# Patient Record
Sex: Female | Born: 1935 | Race: White | Hispanic: No | Marital: Married
Health system: Southern US, Community
[De-identification: ages and names within clinical notes are randomized; demographics above are authoritative.]

---

## 2003-10-17 ENCOUNTER — Other Ambulatory Visit: Payer: Self-pay

## 2004-08-29 ENCOUNTER — Ambulatory Visit: Payer: Self-pay | Admitting: Internal Medicine

## 2004-09-29 ENCOUNTER — Ambulatory Visit: Payer: Self-pay | Admitting: Internal Medicine

## 2004-10-29 ENCOUNTER — Ambulatory Visit: Payer: Self-pay | Admitting: Internal Medicine

## 2004-11-29 ENCOUNTER — Ambulatory Visit: Payer: Self-pay | Admitting: Internal Medicine

## 2004-12-23 ENCOUNTER — Emergency Department: Payer: Self-pay | Admitting: Emergency Medicine

## 2004-12-28 ENCOUNTER — Ambulatory Visit: Payer: Self-pay | Admitting: Internal Medicine

## 2004-12-30 ENCOUNTER — Ambulatory Visit: Payer: Self-pay | Admitting: Internal Medicine

## 2005-01-27 ENCOUNTER — Ambulatory Visit: Payer: Self-pay | Admitting: Internal Medicine

## 2005-02-27 ENCOUNTER — Ambulatory Visit: Payer: Self-pay | Admitting: Internal Medicine

## 2005-03-30 ENCOUNTER — Ambulatory Visit: Payer: Self-pay | Admitting: Gastroenterology

## 2005-04-22 ENCOUNTER — Ambulatory Visit: Payer: Self-pay | Admitting: Internal Medicine

## 2005-04-29 ENCOUNTER — Ambulatory Visit: Payer: Self-pay | Admitting: Internal Medicine

## 2005-05-12 ENCOUNTER — Ambulatory Visit: Payer: Self-pay | Admitting: Internal Medicine

## 2005-06-17 ENCOUNTER — Ambulatory Visit: Payer: Self-pay | Admitting: Internal Medicine

## 2005-06-29 ENCOUNTER — Ambulatory Visit: Payer: Self-pay | Admitting: Internal Medicine

## 2005-07-07 ENCOUNTER — Ambulatory Visit: Payer: Self-pay | Admitting: Unknown Physician Specialty

## 2005-08-16 ENCOUNTER — Ambulatory Visit: Payer: Self-pay | Admitting: Internal Medicine

## 2005-08-29 ENCOUNTER — Ambulatory Visit: Payer: Self-pay | Admitting: Internal Medicine

## 2005-10-15 ENCOUNTER — Ambulatory Visit: Payer: Self-pay | Admitting: Internal Medicine

## 2005-10-29 ENCOUNTER — Ambulatory Visit: Payer: Self-pay | Admitting: Internal Medicine

## 2005-12-14 ENCOUNTER — Ambulatory Visit: Payer: Self-pay | Admitting: Internal Medicine

## 2005-12-30 ENCOUNTER — Ambulatory Visit: Payer: Self-pay | Admitting: Internal Medicine

## 2006-02-11 ENCOUNTER — Ambulatory Visit: Payer: Self-pay | Admitting: Internal Medicine

## 2006-02-27 ENCOUNTER — Ambulatory Visit: Payer: Self-pay | Admitting: Internal Medicine

## 2006-04-13 ENCOUNTER — Ambulatory Visit: Payer: Self-pay | Admitting: Internal Medicine

## 2006-04-29 ENCOUNTER — Ambulatory Visit: Payer: Self-pay | Admitting: Internal Medicine

## 2006-06-08 ENCOUNTER — Ambulatory Visit: Payer: Self-pay | Admitting: Internal Medicine

## 2006-06-14 ENCOUNTER — Ambulatory Visit: Payer: Self-pay | Admitting: Internal Medicine

## 2006-06-29 ENCOUNTER — Ambulatory Visit: Payer: Self-pay | Admitting: Internal Medicine

## 2006-09-12 ENCOUNTER — Ambulatory Visit: Payer: Self-pay | Admitting: Internal Medicine

## 2006-09-29 ENCOUNTER — Ambulatory Visit: Payer: Self-pay | Admitting: Internal Medicine

## 2006-12-09 ENCOUNTER — Ambulatory Visit: Payer: Self-pay | Admitting: Internal Medicine

## 2006-12-30 ENCOUNTER — Ambulatory Visit: Payer: Self-pay | Admitting: Internal Medicine

## 2007-02-09 ENCOUNTER — Inpatient Hospital Stay: Payer: Self-pay | Admitting: Internal Medicine

## 2007-02-09 ENCOUNTER — Other Ambulatory Visit: Payer: Self-pay

## 2007-03-10 ENCOUNTER — Ambulatory Visit: Payer: Self-pay | Admitting: Internal Medicine

## 2007-03-30 ENCOUNTER — Ambulatory Visit: Payer: Self-pay | Admitting: Internal Medicine

## 2007-04-04 ENCOUNTER — Ambulatory Visit: Payer: Self-pay | Admitting: Gastroenterology

## 2007-05-23 ENCOUNTER — Ambulatory Visit: Payer: Self-pay | Admitting: Physician Assistant

## 2007-05-30 ENCOUNTER — Ambulatory Visit: Payer: Self-pay | Admitting: Internal Medicine

## 2007-06-09 ENCOUNTER — Ambulatory Visit: Payer: Self-pay | Admitting: Internal Medicine

## 2007-06-30 ENCOUNTER — Ambulatory Visit: Payer: Self-pay | Admitting: Internal Medicine

## 2007-07-19 ENCOUNTER — Ambulatory Visit: Payer: Self-pay | Admitting: Internal Medicine

## 2007-07-31 ENCOUNTER — Ambulatory Visit: Payer: Self-pay | Admitting: Internal Medicine

## 2007-09-08 ENCOUNTER — Ambulatory Visit: Payer: Self-pay | Admitting: Internal Medicine

## 2007-09-30 ENCOUNTER — Ambulatory Visit: Payer: Self-pay | Admitting: Internal Medicine

## 2007-12-08 ENCOUNTER — Ambulatory Visit: Payer: Self-pay | Admitting: Internal Medicine

## 2007-12-31 ENCOUNTER — Ambulatory Visit: Payer: Self-pay | Admitting: Internal Medicine

## 2008-02-28 ENCOUNTER — Ambulatory Visit: Payer: Self-pay | Admitting: Internal Medicine

## 2008-03-29 ENCOUNTER — Ambulatory Visit: Payer: Self-pay | Admitting: Internal Medicine

## 2008-05-29 ENCOUNTER — Ambulatory Visit: Payer: Self-pay | Admitting: Internal Medicine

## 2008-06-29 ENCOUNTER — Ambulatory Visit: Payer: Self-pay | Admitting: Internal Medicine

## 2008-07-30 ENCOUNTER — Ambulatory Visit: Payer: Self-pay | Admitting: Internal Medicine

## 2008-09-06 ENCOUNTER — Ambulatory Visit: Payer: Self-pay | Admitting: Internal Medicine

## 2008-09-13 ENCOUNTER — Emergency Department: Payer: Self-pay | Admitting: Emergency Medicine

## 2008-09-29 ENCOUNTER — Ambulatory Visit: Payer: Self-pay | Admitting: Internal Medicine

## 2008-11-29 ENCOUNTER — Ambulatory Visit: Payer: Self-pay | Admitting: Internal Medicine

## 2008-12-09 ENCOUNTER — Ambulatory Visit: Payer: Self-pay | Admitting: Internal Medicine

## 2008-12-30 ENCOUNTER — Ambulatory Visit: Payer: Self-pay | Admitting: Internal Medicine

## 2009-03-11 ENCOUNTER — Ambulatory Visit: Payer: Self-pay | Admitting: Internal Medicine

## 2009-03-29 ENCOUNTER — Ambulatory Visit: Payer: Self-pay | Admitting: Internal Medicine

## 2009-05-29 ENCOUNTER — Ambulatory Visit: Payer: Self-pay | Admitting: Internal Medicine

## 2009-06-10 ENCOUNTER — Ambulatory Visit: Payer: Self-pay | Admitting: Internal Medicine

## 2009-06-29 ENCOUNTER — Ambulatory Visit: Payer: Self-pay | Admitting: Internal Medicine

## 2009-07-30 ENCOUNTER — Ambulatory Visit: Payer: Self-pay | Admitting: Internal Medicine

## 2009-10-07 ENCOUNTER — Ambulatory Visit: Payer: Self-pay | Admitting: Internal Medicine

## 2009-10-29 ENCOUNTER — Ambulatory Visit: Payer: Self-pay | Admitting: Internal Medicine

## 2010-03-25 ENCOUNTER — Ambulatory Visit: Payer: Self-pay | Admitting: Internal Medicine

## 2010-03-29 ENCOUNTER — Ambulatory Visit: Payer: Self-pay | Admitting: Internal Medicine

## 2010-07-14 ENCOUNTER — Ambulatory Visit: Payer: Self-pay | Admitting: Internal Medicine

## 2010-07-30 ENCOUNTER — Ambulatory Visit: Payer: Self-pay | Admitting: Internal Medicine

## 2010-08-29 ENCOUNTER — Ambulatory Visit: Payer: Self-pay | Admitting: Internal Medicine

## 2010-10-15 ENCOUNTER — Ambulatory Visit: Payer: Self-pay | Admitting: Internal Medicine

## 2010-10-29 ENCOUNTER — Ambulatory Visit: Payer: Self-pay | Admitting: Internal Medicine

## 2011-01-15 ENCOUNTER — Ambulatory Visit: Payer: Self-pay | Admitting: Internal Medicine

## 2011-01-28 ENCOUNTER — Ambulatory Visit: Payer: Self-pay | Admitting: Internal Medicine

## 2011-04-15 ENCOUNTER — Ambulatory Visit: Payer: Self-pay | Admitting: Internal Medicine

## 2011-04-30 ENCOUNTER — Ambulatory Visit: Payer: Self-pay | Admitting: Internal Medicine

## 2011-05-26 ENCOUNTER — Ambulatory Visit: Payer: Self-pay | Admitting: Orthopedic Surgery

## 2011-06-23 ENCOUNTER — Emergency Department: Payer: Self-pay | Admitting: Emergency Medicine

## 2011-07-16 ENCOUNTER — Ambulatory Visit: Payer: Self-pay | Admitting: Internal Medicine

## 2011-07-31 ENCOUNTER — Ambulatory Visit: Payer: Self-pay | Admitting: Internal Medicine

## 2011-08-30 ENCOUNTER — Ambulatory Visit: Payer: Self-pay | Admitting: Internal Medicine

## 2011-09-30 ENCOUNTER — Ambulatory Visit: Payer: Self-pay | Admitting: Internal Medicine

## 2011-10-30 ENCOUNTER — Ambulatory Visit: Payer: Self-pay | Admitting: Internal Medicine

## 2011-12-08 ENCOUNTER — Ambulatory Visit: Payer: Self-pay | Admitting: Internal Medicine

## 2011-12-08 LAB — CBC CANCER CENTER
Basophil #: 0.1 x10 3/mm (ref 0.0–0.1)
Eosinophil #: 0.8 x10 3/mm — ABNORMAL HIGH (ref 0.0–0.7)
HCT: 33.3 % — ABNORMAL LOW (ref 35.0–47.0)
Lymphocyte %: 28 %
MCH: 37.9 pg — ABNORMAL HIGH (ref 26.0–34.0)
MCHC: 33.8 g/dL (ref 32.0–36.0)
Monocyte #: 0.2 x10 3/mm (ref 0.0–0.7)
Neutrophil #: 3.1 x10 3/mm (ref 1.4–6.5)
RDW: 17.8 % — ABNORMAL HIGH (ref 11.5–14.5)
WBC: 5.8 x10 3/mm (ref 3.6–11.0)

## 2011-12-31 ENCOUNTER — Ambulatory Visit: Payer: Self-pay | Admitting: Internal Medicine

## 2012-03-08 ENCOUNTER — Ambulatory Visit: Payer: Self-pay | Admitting: Internal Medicine

## 2012-03-08 LAB — CBC CANCER CENTER
Basophil %: 1 %
Eosinophil #: 0.9 x10 3/mm — ABNORMAL HIGH (ref 0.0–0.7)
Eosinophil %: 13.4 %
HCT: 34.4 % — ABNORMAL LOW (ref 35.0–47.0)
Lymphocyte #: 2.2 x10 3/mm (ref 1.0–3.6)
Lymphocyte %: 33.1 %
MCHC: 33.5 g/dL (ref 32.0–36.0)
MCV: 114 fL — ABNORMAL HIGH (ref 80–100)
Neutrophil #: 3 x10 3/mm (ref 1.4–6.5)
RBC: 3.02 10*6/uL — ABNORMAL LOW (ref 3.80–5.20)
WBC: 6.5 x10 3/mm (ref 3.6–11.0)

## 2012-03-29 ENCOUNTER — Ambulatory Visit: Payer: Self-pay | Admitting: Internal Medicine

## 2012-06-07 ENCOUNTER — Ambulatory Visit: Payer: Self-pay | Admitting: Internal Medicine

## 2012-06-07 LAB — CBC CANCER CENTER
Basophil #: 0.1 x10 3/mm (ref 0.0–0.1)
Basophil %: 1.2 %
Eosinophil %: 19.6 %
HCT: 34.1 % — ABNORMAL LOW (ref 35.0–47.0)
HGB: 11.1 g/dL — ABNORMAL LOW (ref 12.0–16.0)
MCH: 37.2 pg — ABNORMAL HIGH (ref 26.0–34.0)
MCV: 114 fL — ABNORMAL HIGH (ref 80–100)
Monocyte %: 3.7 %
Platelet: 268 x10 3/mm (ref 150–440)
RBC: 2.99 10*6/uL — ABNORMAL LOW (ref 3.80–5.20)
RDW: 18 % — ABNORMAL HIGH (ref 11.5–14.5)
WBC: 5.5 x10 3/mm (ref 3.6–11.0)

## 2012-06-29 ENCOUNTER — Ambulatory Visit: Payer: Self-pay | Admitting: Internal Medicine

## 2012-09-13 ENCOUNTER — Ambulatory Visit: Payer: Self-pay | Admitting: Internal Medicine

## 2012-09-13 LAB — CBC CANCER CENTER
Basophil #: 0.1 x10 3/mm (ref 0.0–0.1)
Basophil %: 2.3 %
Eosinophil #: 0.8 x10 3/mm — ABNORMAL HIGH (ref 0.0–0.7)
Eosinophil %: 13.9 %
HGB: 11.2 g/dL — ABNORMAL LOW (ref 12.0–16.0)
Lymphocyte %: 25 %
MCH: 37.1 pg — ABNORMAL HIGH (ref 26.0–34.0)
MCHC: 32.7 g/dL (ref 32.0–36.0)
Monocyte %: 3.8 %
Neutrophil %: 55 %
Platelet: 325 x10 3/mm (ref 150–440)
RDW: 19.3 % — ABNORMAL HIGH (ref 11.5–14.5)
WBC: 5.5 x10 3/mm (ref 3.6–11.0)

## 2012-09-29 ENCOUNTER — Ambulatory Visit: Payer: Self-pay | Admitting: Internal Medicine

## 2012-12-14 ENCOUNTER — Ambulatory Visit: Payer: Self-pay | Admitting: Internal Medicine

## 2012-12-15 LAB — CBC CANCER CENTER
Basophil %: 3.1 %
Eosinophil #: 0.1 x10 3/mm (ref 0.0–0.7)
HCT: 35.1 % (ref 35.0–47.0)
HGB: 11.9 g/dL — ABNORMAL LOW (ref 12.0–16.0)
Lymphocyte #: 1.7 x10 3/mm (ref 1.0–3.6)
MCHC: 34 g/dL (ref 32.0–36.0)
Monocyte #: 0.1 x10 3/mm — ABNORMAL LOW (ref 0.2–0.9)
Monocyte %: 2 %
Neutrophil #: 2.5 x10 3/mm (ref 1.4–6.5)
Platelet: 373 x10 3/mm (ref 150–440)
RBC: 3.12 10*6/uL — ABNORMAL LOW (ref 3.80–5.20)
RDW: 17.7 % — ABNORMAL HIGH (ref 11.5–14.5)

## 2012-12-23 ENCOUNTER — Inpatient Hospital Stay: Payer: Self-pay | Admitting: Orthopedic Surgery

## 2012-12-23 ENCOUNTER — Ambulatory Visit: Payer: Self-pay | Admitting: Orthopedic Surgery

## 2012-12-23 LAB — COMPREHENSIVE METABOLIC PANEL
Albumin: 3.6 g/dL (ref 3.4–5.0)
Alkaline Phosphatase: 76 U/L (ref 50–136)
BUN: 16 mg/dL (ref 7–18)
Bilirubin,Total: 0.4 mg/dL (ref 0.2–1.0)
Calcium, Total: 8.4 mg/dL — ABNORMAL LOW (ref 8.5–10.1)
Chloride: 104 mmol/L (ref 98–107)
Co2: 29 mmol/L (ref 21–32)
EGFR (Non-African Amer.): 60 — ABNORMAL LOW
Potassium: 3.9 mmol/L (ref 3.5–5.1)
SGOT(AST): 16 U/L (ref 15–37)
Sodium: 140 mmol/L (ref 136–145)
Total Protein: 6.5 g/dL (ref 6.4–8.2)

## 2012-12-23 LAB — PROTIME-INR
INR: 1
Prothrombin Time: 13.4 secs (ref 11.5–14.7)

## 2012-12-23 LAB — CBC WITH DIFFERENTIAL/PLATELET
Basophil #: 0.1 10*3/uL (ref 0.0–0.1)
Basophil %: 1 %
Eosinophil #: 1.5 10*3/uL — ABNORMAL HIGH (ref 0.0–0.7)
HGB: 10.7 g/dL — ABNORMAL LOW (ref 12.0–16.0)
Monocyte %: 3.7 %
Neutrophil %: 69.8 %
Platelet: 376 10*3/uL (ref 150–440)
WBC: 13 10*3/uL — ABNORMAL HIGH (ref 3.6–11.0)

## 2012-12-26 LAB — CBC WITH DIFFERENTIAL/PLATELET
Basophil #: 0 10*3/uL (ref 0.0–0.1)
Basophil %: 0.7 %
Eosinophil #: 0.2 10*3/uL (ref 0.0–0.7)
HCT: 17 % — ABNORMAL LOW (ref 35.0–47.0)
HGB: 5.7 g/dL — ABNORMAL LOW (ref 12.0–16.0)
Lymphocyte #: 1.5 10*3/uL (ref 1.0–3.6)
Lymphocyte %: 22.7 %
MCH: 38.7 pg — ABNORMAL HIGH (ref 26.0–34.0)
Monocyte %: 11.5 %
Neutrophil #: 4.2 10*3/uL (ref 1.4–6.5)
Platelet: 241 10*3/uL (ref 150–440)
WBC: 6.6 10*3/uL (ref 3.6–11.0)

## 2012-12-26 LAB — URINALYSIS, COMPLETE
Glucose,UR: NEGATIVE mg/dL (ref 0–75)
Ketone: NEGATIVE
Nitrite: NEGATIVE
Specific Gravity: 1.015 (ref 1.003–1.030)
WBC UR: 33 /HPF (ref 0–5)

## 2012-12-26 LAB — HEMOGLOBIN: HGB: 7.6 g/dL — ABNORMAL LOW (ref 12.0–16.0)

## 2012-12-26 LAB — BASIC METABOLIC PANEL
BUN: 13 mg/dL (ref 7–18)
Co2: 27 mmol/L (ref 21–32)
Creatinine: 0.63 mg/dL (ref 0.60–1.30)
EGFR (African American): >60
Glucose: 98 mg/dL (ref 65–99)
Osmolality: 281 (ref 275–301)
Potassium: 4 mmol/L (ref 3.5–5.1)

## 2012-12-27 LAB — HEMOGLOBIN: HGB: 6.7 g/dL — ABNORMAL LOW (ref 12.0–16.0)

## 2012-12-28 ENCOUNTER — Encounter: Payer: Self-pay | Admitting: Internal Medicine

## 2012-12-28 LAB — BASIC METABOLIC PANEL
BUN: 9 mg/dL (ref 7–18)
Calcium, Total: 8.1 mg/dL — ABNORMAL LOW (ref 8.5–10.1)
Chloride: 109 mmol/L — ABNORMAL HIGH (ref 98–107)
Co2: 28 mmol/L (ref 21–32)
Creatinine: 0.63 mg/dL (ref 0.60–1.30)
EGFR (African American): >60

## 2012-12-28 LAB — URINE CULTURE

## 2012-12-30 ENCOUNTER — Ambulatory Visit: Payer: Self-pay | Admitting: Internal Medicine

## 2012-12-30 ENCOUNTER — Encounter: Payer: Self-pay | Admitting: Internal Medicine

## 2013-02-27 ENCOUNTER — Ambulatory Visit: Payer: Self-pay | Admitting: Internal Medicine

## 2013-03-15 LAB — CBC CANCER CENTER
Eosinophil #: 1.3 x10 3/mm — ABNORMAL HIGH (ref 0.0–0.7)
HCT: 30.8 % — ABNORMAL LOW (ref 35.0–47.0)
Lymphocyte #: 2.4 x10 3/mm (ref 1.0–3.6)
Lymphocyte %: 47.8 %
MCH: 37.5 pg — ABNORMAL HIGH (ref 26.0–34.0)
MCHC: 33.7 g/dL (ref 32.0–36.0)
MCV: 111 fL — ABNORMAL HIGH (ref 80–100)
Monocyte #: 0.2 x10 3/mm (ref 0.2–0.9)
Neutrophil %: 21.6 %
Platelet: 235 x10 3/mm (ref 150–440)
WBC: 5 x10 3/mm (ref 3.6–11.0)

## 2013-03-29 ENCOUNTER — Ambulatory Visit: Payer: Self-pay | Admitting: Internal Medicine

## 2013-06-14 ENCOUNTER — Ambulatory Visit: Payer: Self-pay | Admitting: Internal Medicine

## 2013-06-25 LAB — CBC CANCER CENTER
Basophil #: 0 x10 3/mm (ref 0.0–0.1)
Basophil %: 1.3 %
Eosinophil #: 0 x10 3/mm (ref 0.0–0.7)
Eosinophil %: 0.9 %
HCT: 33.5 % — ABNORMAL LOW (ref 35.0–47.0)
HGB: 11.7 g/dL — ABNORMAL LOW (ref 12.0–16.0)
Lymphocyte #: 1.3 x10 3/mm (ref 1.0–3.6)
MCHC: 35 g/dL (ref 32.0–36.0)
Monocyte %: 2.4 %
Neutrophil #: 2.5 x10 3/mm (ref 1.4–6.5)
Neutrophil %: 62.2 %

## 2013-06-29 ENCOUNTER — Ambulatory Visit: Payer: Self-pay | Admitting: Internal Medicine

## 2013-09-24 ENCOUNTER — Ambulatory Visit: Payer: Self-pay | Admitting: Internal Medicine

## 2013-09-24 LAB — CBC CANCER CENTER
Basophil %: 2.6 %
Eosinophil #: 2.4 x10 3/mm — ABNORMAL HIGH (ref 0.0–0.7)
Eosinophil %: 27.4 %
HGB: 12 g/dL (ref 12.0–16.0)
Lymphocyte #: 2.9 x10 3/mm (ref 1.0–3.6)
Lymphocyte %: 32.1 %
MCHC: 33.9 g/dL (ref 32.0–36.0)
MCV: 115 fL — ABNORMAL HIGH (ref 80–100)
Monocyte #: 0.6 x10 3/mm (ref 0.2–0.9)
Neutrophil #: 2.8 x10 3/mm (ref 1.4–6.5)
Neutrophil %: 31.5 %
Platelet: 372 x10 3/mm (ref 150–440)
RBC: 3.08 10*6/uL — ABNORMAL LOW (ref 3.80–5.20)
RDW: 18.4 % — ABNORMAL HIGH (ref 11.5–14.5)

## 2013-09-29 ENCOUNTER — Ambulatory Visit: Payer: Self-pay | Admitting: Internal Medicine

## 2013-12-14 ENCOUNTER — Ambulatory Visit: Payer: Self-pay | Admitting: Internal Medicine

## 2013-12-14 LAB — CBC CANCER CENTER
BASOS PCT: 2.5 %
Basophil #: 0.1 x10 3/mm (ref 0.0–0.1)
EOS ABS: 0.5 x10 3/mm (ref 0.0–0.7)
EOS PCT: 8.8 %
HCT: 33 % — AB (ref 35.0–47.0)
HGB: 10.8 g/dL — ABNORMAL LOW (ref 12.0–16.0)
LYMPHS ABS: 2.1 x10 3/mm (ref 1.0–3.6)
Lymphocyte %: 38 %
MCH: 37.8 pg — AB (ref 26.0–34.0)
MCHC: 32.8 g/dL (ref 32.0–36.0)
MCV: 115 fL — ABNORMAL HIGH (ref 80–100)
MONO ABS: 0.4 x10 3/mm (ref 0.2–0.9)
MONOS PCT: 7.4 %
NEUTROS ABS: 2.4 x10 3/mm (ref 1.4–6.5)
Neutrophil %: 43.3 %
PLATELETS: 320 x10 3/mm (ref 150–440)
RBC: 2.86 10*6/uL — AB (ref 3.80–5.20)
RDW: 19.3 % — AB (ref 11.5–14.5)
WBC: 5.5 x10 3/mm (ref 3.6–11.0)

## 2013-12-30 ENCOUNTER — Ambulatory Visit: Payer: Self-pay | Admitting: Internal Medicine

## 2014-01-02 LAB — CBC CANCER CENTER
BASOS PCT: 1.4 %
Basophil #: 0.1 x10 3/mm (ref 0.0–0.1)
EOS PCT: 12.9 %
Eosinophil #: 0.9 x10 3/mm — ABNORMAL HIGH (ref 0.0–0.7)
HCT: 35.5 % (ref 35.0–47.0)
HGB: 11.7 g/dL — AB (ref 12.0–16.0)
Lymphocyte #: 2.8 x10 3/mm (ref 1.0–3.6)
Lymphocyte %: 38.6 %
MCH: 38.4 pg — ABNORMAL HIGH (ref 26.0–34.0)
MCHC: 32.9 g/dL (ref 32.0–36.0)
MCV: 117 fL — AB (ref 80–100)
Monocyte #: 0.4 x10 3/mm (ref 0.2–0.9)
Monocyte %: 5.9 %
NEUTROS PCT: 41.2 %
Neutrophil #: 3 x10 3/mm (ref 1.4–6.5)
PLATELETS: 340 x10 3/mm (ref 150–440)
RBC: 3.04 10*6/uL — AB (ref 3.80–5.20)
RDW: 19.6 % — AB (ref 11.5–14.5)
WBC: 7.2 x10 3/mm (ref 3.6–11.0)

## 2014-01-02 LAB — FERRITIN: Ferritin (ARMC): 217 ng/mL (ref 8–388)

## 2014-01-02 LAB — IRON AND TIBC
IRON: 113 ug/dL (ref 50–170)
Iron Bind.Cap.(Total): 265 ug/dL (ref 250–450)
Iron Saturation: 43 %
Unbound Iron-Bind.Cap.: 152 ug/dL

## 2014-01-27 ENCOUNTER — Ambulatory Visit: Payer: Self-pay | Admitting: Internal Medicine

## 2014-01-30 LAB — CBC CANCER CENTER
Basophil #: 0.2 x10 3/mm — ABNORMAL HIGH (ref 0.0–0.1)
Basophil %: 1.8 %
Eosinophil #: 1.6 x10 3/mm — ABNORMAL HIGH (ref 0.0–0.7)
Eosinophil %: 13.9 %
HCT: 36.8 % (ref 35.0–47.0)
HGB: 11.8 g/dL — ABNORMAL LOW (ref 12.0–16.0)
LYMPHS ABS: 3.5 x10 3/mm (ref 1.0–3.6)
Lymphocyte %: 30.9 %
MCH: 37.3 pg — ABNORMAL HIGH (ref 26.0–34.0)
MCHC: 32 g/dL (ref 32.0–36.0)
MCV: 117 fL — ABNORMAL HIGH (ref 80–100)
Monocyte #: 0.6 x10 3/mm (ref 0.2–0.9)
Monocyte %: 5.2 %
NEUTROS ABS: 5.5 x10 3/mm (ref 1.4–6.5)
Neutrophil %: 48.2 %
Platelet: 440 x10 3/mm (ref 150–440)
RBC: 3.15 10*6/uL — ABNORMAL LOW (ref 3.80–5.20)
RDW: 19.5 % — AB (ref 11.5–14.5)
WBC: 11.4 x10 3/mm — ABNORMAL HIGH (ref 3.6–11.0)

## 2014-02-20 LAB — CBC CANCER CENTER
Basophil #: 0.1 x10 3/mm (ref 0.0–0.1)
Basophil %: 2.3 %
Eosinophil #: 0.3 x10 3/mm (ref 0.0–0.7)
Eosinophil %: 4.7 %
HCT: 31.1 % — AB (ref 35.0–47.0)
HGB: 10.2 g/dL — AB (ref 12.0–16.0)
LYMPHS ABS: 1.4 x10 3/mm (ref 1.0–3.6)
Lymphocyte %: 23.2 %
MCH: 37.4 pg — ABNORMAL HIGH (ref 26.0–34.0)
MCHC: 32.7 g/dL (ref 32.0–36.0)
MCV: 114 fL — AB (ref 80–100)
MONO ABS: 0.3 x10 3/mm (ref 0.2–0.9)
MONOS PCT: 4.5 %
NEUTROS PCT: 65.3 %
Neutrophil #: 3.9 x10 3/mm (ref 1.4–6.5)
Platelet: 395 x10 3/mm (ref 150–440)
RBC: 2.72 10*6/uL — ABNORMAL LOW (ref 3.80–5.20)
RDW: 19.7 % — AB (ref 11.5–14.5)
WBC: 6 x10 3/mm (ref 3.6–11.0)

## 2014-02-27 ENCOUNTER — Ambulatory Visit: Payer: Self-pay | Admitting: Internal Medicine

## 2014-03-07 LAB — OCCULT BLOOD X 1 CARD TO LAB, STOOL
OCCULT BLOOD, FECES: NEGATIVE
OCCULT BLOOD, FECES: NEGATIVE
Occult Blood, Feces: NEGATIVE

## 2014-03-11 LAB — CBC CANCER CENTER
Basophil #: 0.1 x10 3/mm (ref 0.0–0.1)
Basophil %: 1.4 %
Eosinophil #: 0.3 x10 3/mm (ref 0.0–0.7)
Eosinophil %: 4.1 %
HCT: 33.4 % — ABNORMAL LOW (ref 35.0–47.0)
HGB: 10.8 g/dL — ABNORMAL LOW (ref 12.0–16.0)
Lymphocyte #: 0.9 x10 3/mm — ABNORMAL LOW (ref 1.0–3.6)
Lymphocyte %: 12 %
MCH: 37.9 pg — ABNORMAL HIGH (ref 26.0–34.0)
MCHC: 32.5 g/dL (ref 32.0–36.0)
MCV: 117 fL — ABNORMAL HIGH (ref 80–100)
MONOS PCT: 4.1 %
Monocyte #: 0.3 x10 3/mm (ref 0.2–0.9)
NEUTROS PCT: 78.4 %
Neutrophil #: 6.1 x10 3/mm (ref 1.4–6.5)
Platelet: 333 x10 3/mm (ref 150–440)
RBC: 2.86 10*6/uL — AB (ref 3.80–5.20)
RDW: 20.4 % — ABNORMAL HIGH (ref 11.5–14.5)
WBC: 7.8 x10 3/mm (ref 3.6–11.0)

## 2014-03-11 LAB — RETICULOCYTES
ABSOLUTE RETIC COUNT: 0.0509 10*6/uL (ref 0.019–0.186)
RETICULOCYTE: 1.78 % (ref 0.4–3.1)

## 2014-03-11 LAB — CREATININE, SERUM
Creatinine: 0.78 mg/dL (ref 0.60–1.30)
EGFR (African American): >60

## 2014-03-12 LAB — KAPPA/LAMBDA FREE LIGHT CHAINS (ARMC)

## 2014-03-12 LAB — PROT IMMUNOELECTROPHORES(ARMC)

## 2014-03-29 ENCOUNTER — Ambulatory Visit: Payer: Self-pay | Admitting: Internal Medicine

## 2014-04-08 LAB — CBC CANCER CENTER
BASOS ABS: 0.2 x10 3/mm — AB (ref 0.0–0.1)
Basophil %: 2.7 %
EOS PCT: 2.8 %
Eosinophil #: 0.2 x10 3/mm (ref 0.0–0.7)
HCT: 34.3 % — ABNORMAL LOW (ref 35.0–47.0)
HGB: 11.5 g/dL — ABNORMAL LOW (ref 12.0–16.0)
Lymphocyte #: 1.2 x10 3/mm (ref 1.0–3.6)
Lymphocyte %: 21.1 %
MCH: 38.5 pg — AB (ref 26.0–34.0)
MCHC: 33.6 g/dL (ref 32.0–36.0)
MCV: 115 fL — AB (ref 80–100)
Monocyte #: 0.1 x10 3/mm — ABNORMAL LOW (ref 0.2–0.9)
Monocyte %: 2.1 %
Neutrophil #: 4.2 x10 3/mm (ref 1.4–6.5)
Neutrophil %: 71.3 %
Platelet: 344 x10 3/mm (ref 150–440)
RBC: 3 10*6/uL — AB (ref 3.80–5.20)
RDW: 20.5 % — ABNORMAL HIGH (ref 11.5–14.5)
WBC: 5.8 x10 3/mm (ref 3.6–11.0)

## 2014-04-29 ENCOUNTER — Ambulatory Visit: Payer: Self-pay | Admitting: Internal Medicine

## 2014-06-10 ENCOUNTER — Ambulatory Visit: Payer: Self-pay | Admitting: Internal Medicine

## 2014-06-10 LAB — CBC CANCER CENTER
BASOS PCT: 1.8 %
Basophil #: 0.1 x10 3/mm (ref 0.0–0.1)
EOS PCT: 3.1 %
Eosinophil #: 0.2 x10 3/mm (ref 0.0–0.7)
HCT: 34.4 % — AB (ref 35.0–47.0)
HGB: 11.2 g/dL — ABNORMAL LOW (ref 12.0–16.0)
LYMPHS PCT: 17.3 %
Lymphocyte #: 1.2 x10 3/mm (ref 1.0–3.6)
MCH: 37.6 pg — AB (ref 26.0–34.0)
MCHC: 32.5 g/dL (ref 32.0–36.0)
MCV: 116 fL — AB (ref 80–100)
MONO ABS: 0.1 x10 3/mm — AB (ref 0.2–0.9)
Monocyte %: 1.4 %
NEUTROS ABS: 5.4 x10 3/mm (ref 1.4–6.5)
Neutrophil %: 76.4 %
PLATELETS: 353 x10 3/mm (ref 150–440)
RBC: 2.97 10*6/uL — ABNORMAL LOW (ref 3.80–5.20)
RDW: 20.8 % — ABNORMAL HIGH (ref 11.5–14.5)
WBC: 7.1 x10 3/mm (ref 3.6–11.0)

## 2014-06-29 ENCOUNTER — Ambulatory Visit: Payer: Self-pay | Admitting: Internal Medicine

## 2014-08-06 ENCOUNTER — Ambulatory Visit: Payer: Self-pay | Admitting: Internal Medicine

## 2014-08-06 LAB — CBC CANCER CENTER
Basophil #: 0.1 x10 3/mm (ref 0.0–0.1)
Basophil %: 1.4 %
Eosinophil #: 0.8 x10 3/mm — ABNORMAL HIGH (ref 0.0–0.7)
Eosinophil %: 11.2 %
HCT: 33.6 % — AB (ref 35.0–47.0)
HGB: 10.9 g/dL — ABNORMAL LOW (ref 12.0–16.0)
Lymphocyte #: 3 x10 3/mm (ref 1.0–3.6)
Lymphocyte %: 40.2 %
MCH: 37.9 pg — ABNORMAL HIGH (ref 26.0–34.0)
MCHC: 32.5 g/dL (ref 32.0–36.0)
MCV: 117 fL — AB (ref 80–100)
Monocyte #: 0.4 x10 3/mm (ref 0.2–0.9)
Monocyte %: 5.5 %
Neutrophil #: 3.1 x10 3/mm (ref 1.4–6.5)
Neutrophil %: 41.7 %
PLATELETS: 319 x10 3/mm (ref 150–440)
RBC: 2.88 10*6/uL — AB (ref 3.80–5.20)
RDW: 21.4 % — ABNORMAL HIGH (ref 11.5–14.5)
WBC: 7.3 x10 3/mm (ref 3.6–11.0)

## 2014-08-29 ENCOUNTER — Ambulatory Visit: Payer: Self-pay | Admitting: Internal Medicine

## 2014-09-30 ENCOUNTER — Ambulatory Visit: Payer: Self-pay | Admitting: Internal Medicine

## 2014-09-30 LAB — CBC CANCER CENTER
Basophil #: 0.1 x10 3/mm (ref 0.0–0.1)
Basophil %: 1.1 %
EOS ABS: 0.4 x10 3/mm (ref 0.0–0.7)
EOS PCT: 4.6 %
HCT: 38 % (ref 35.0–47.0)
HGB: 12.6 g/dL (ref 12.0–16.0)
Lymphocyte #: 1.7 x10 3/mm (ref 1.0–3.6)
Lymphocyte %: 17.7 %
MCH: 39.2 pg — ABNORMAL HIGH (ref 26.0–34.0)
MCHC: 33.1 g/dL (ref 32.0–36.0)
MCV: 118 fL — ABNORMAL HIGH (ref 80–100)
MONO ABS: 0.3 x10 3/mm (ref 0.2–0.9)
MONOS PCT: 2.7 %
Neutrophil #: 7.1 x10 3/mm — ABNORMAL HIGH (ref 1.4–6.5)
Neutrophil %: 73.9 %
Platelet: 324 x10 3/mm (ref 150–440)
RBC: 3.21 10*6/uL — AB (ref 3.80–5.20)
RDW: 19.6 % — ABNORMAL HIGH (ref 11.5–14.5)
WBC: 9.6 x10 3/mm (ref 3.6–11.0)

## 2014-10-03 ENCOUNTER — Inpatient Hospital Stay: Payer: Self-pay | Admitting: Internal Medicine

## 2014-10-03 LAB — CBC WITH DIFFERENTIAL/PLATELET
Bands: 9 %
EOS PCT: 4 %
HCT: 28 % — AB (ref 35.0–47.0)
HGB: 9 g/dL — AB (ref 12.0–16.0)
Lymphocytes: 26 %
MCH: 38.5 pg — ABNORMAL HIGH (ref 26.0–34.0)
MCHC: 32.1 g/dL (ref 32.0–36.0)
MCV: 120 fL — AB (ref 80–100)
Monocytes: 10 %
NRBC/100 WBC: 6 /
PLATELETS: 283 10*3/uL (ref 150–440)
RBC: 2.33 10*6/uL — ABNORMAL LOW (ref 3.80–5.20)
RDW: 19.6 % — ABNORMAL HIGH (ref 11.5–14.5)
Segmented Neutrophils: 51 %
WBC: 13.9 10*3/uL — ABNORMAL HIGH (ref 3.6–11.0)

## 2014-10-03 LAB — HEMOGLOBIN: HGB: 8.7 g/dL — ABNORMAL LOW (ref 12.0–16.0)

## 2014-10-03 LAB — BASIC METABOLIC PANEL
Anion Gap: 7 (ref 7–16)
BUN: 16 mg/dL (ref 7–18)
Calcium, Total: 8.2 mg/dL — ABNORMAL LOW (ref 8.5–10.1)
Chloride: 105 mmol/L (ref 98–107)
Co2: 29 mmol/L (ref 21–32)
Creatinine: 0.92 mg/dL (ref 0.60–1.30)
EGFR (African American): >60
Glucose: 134 mg/dL — ABNORMAL HIGH (ref 65–99)
Osmolality: 284 (ref 275–301)
Potassium: 4 mmol/L (ref 3.5–5.1)
SODIUM: 141 mmol/L (ref 136–145)

## 2014-10-03 LAB — APTT: Activated PTT: 25.4 secs (ref 23.6–35.9)

## 2014-10-03 LAB — PROTIME-INR
INR: 1.1
PROTHROMBIN TIME: 13.9 s (ref 11.5–14.7)

## 2014-10-05 LAB — CBC WITH DIFFERENTIAL/PLATELET
BASOS ABS: 0.1 10*3/uL (ref 0.0–0.1)
Basophil %: 0.6 %
Eosinophil #: 0.2 10*3/uL (ref 0.0–0.7)
Eosinophil %: 1 %
HCT: 21.6 % — ABNORMAL LOW (ref 35.0–47.0)
HGB: 7 g/dL — ABNORMAL LOW (ref 12.0–16.0)
LYMPHS ABS: 1.3 10*3/uL (ref 1.0–3.6)
Lymphocyte %: 7.1 %
MCH: 38.2 pg — AB (ref 26.0–34.0)
MCHC: 32.3 g/dL (ref 32.0–36.0)
MCV: 118 fL — AB (ref 80–100)
MONOS PCT: 4 %
Monocyte #: 0.7 x10 3/mm (ref 0.2–0.9)
NEUTROS ABS: 15.3 10*3/uL — AB (ref 1.4–6.5)
Neutrophil %: 87.3 %
Platelet: 197 10*3/uL (ref 150–440)
RBC: 1.82 10*6/uL — ABNORMAL LOW (ref 3.80–5.20)
RDW: 19.6 % — AB (ref 11.5–14.5)
WBC: 17.5 10*3/uL — ABNORMAL HIGH (ref 3.6–11.0)

## 2014-10-05 LAB — BASIC METABOLIC PANEL
Anion Gap: 5 — ABNORMAL LOW (ref 7–16)
BUN: 16 mg/dL (ref 7–18)
CALCIUM: 8.1 mg/dL — AB (ref 8.5–10.1)
CHLORIDE: 105 mmol/L (ref 98–107)
CREATININE: 0.74 mg/dL (ref 0.60–1.30)
Co2: 29 mmol/L (ref 21–32)
EGFR (African American): >60
EGFR (Non-African Amer.): >60
GLUCOSE: 87 mg/dL (ref 65–99)
OSMOLALITY: 278 (ref 275–301)
POTASSIUM: 3.8 mmol/L (ref 3.5–5.1)
Sodium: 139 mmol/L (ref 136–145)

## 2014-10-05 LAB — URINALYSIS, COMPLETE
BACTERIA: NONE SEEN
Bilirubin,UR: NEGATIVE
Glucose,UR: NEGATIVE mg/dL (ref 0–75)
Ketone: NEGATIVE
Nitrite: NEGATIVE
PH: 5 (ref 4.5–8.0)
RBC,UR: 43 /HPF (ref 0–5)
Specific Gravity: 1.023 (ref 1.003–1.030)
Squamous Epithelial: NONE SEEN
WBC UR: 37 /HPF (ref 0–5)

## 2014-10-06 LAB — CBC WITH DIFFERENTIAL/PLATELET
Basophil #: 0.1 10*3/uL (ref 0.0–0.1)
Basophil %: 1 %
Eosinophil #: 0.3 10*3/uL (ref 0.0–0.7)
Eosinophil %: 4.7 %
HCT: 20.7 % — AB (ref 35.0–47.0)
HGB: 6.8 g/dL — ABNORMAL LOW (ref 12.0–16.0)
Lymphocyte #: 0.9 10*3/uL — ABNORMAL LOW (ref 1.0–3.6)
Lymphocyte %: 12.5 %
MCH: 39.2 pg — ABNORMAL HIGH (ref 26.0–34.0)
MCHC: 32.9 g/dL (ref 32.0–36.0)
MCV: 119 fL — ABNORMAL HIGH (ref 80–100)
Monocyte #: 0.4 x10 3/mm (ref 0.2–0.9)
Monocyte %: 5.1 %
Neutrophil #: 5.8 10*3/uL (ref 1.4–6.5)
Neutrophil %: 76.7 %
Platelet: 206 10*3/uL (ref 150–440)
RBC: 1.73 10*6/uL — AB (ref 3.80–5.20)
RDW: 19.4 % — ABNORMAL HIGH (ref 11.5–14.5)
WBC: 7.5 10*3/uL (ref 3.6–11.0)

## 2014-10-06 LAB — BASIC METABOLIC PANEL
ANION GAP: 7 (ref 7–16)
ANION GAP: 5 — AB (ref 7–16)
BUN: 12 mg/dL (ref 7–18)
BUN: 12 mg/dL (ref 7–18)
CO2: 29 mmol/L (ref 21–32)
Calcium, Total: 7.7 mg/dL — ABNORMAL LOW (ref 8.5–10.1)
Calcium, Total: 8 mg/dL — ABNORMAL LOW (ref 8.5–10.1)
Chloride: 106 mmol/L (ref 98–107)
Chloride: 106 mmol/L (ref 98–107)
Co2: 29 mmol/L (ref 21–32)
Creatinine: 0.78 mg/dL (ref 0.60–1.30)
Creatinine: 0.74 mg/dL (ref 0.60–1.30)
EGFR (African American): >60
EGFR (Non-African Amer.): >60
Glucose: 82 mg/dL (ref 65–99)
Glucose: 103 mg/dL — ABNORMAL HIGH (ref 65–99)
OSMOLALITY: 279 (ref 275–301)
Osmolality: 282 (ref 275–301)
POTASSIUM: 3.5 mmol/L (ref 3.5–5.1)
Potassium: 3.9 mmol/L (ref 3.5–5.1)
Sodium: 142 mmol/L (ref 136–145)
Sodium: 140 mmol/L (ref 136–145)

## 2014-10-06 LAB — PLATELET COUNT: Platelet: 209 10*3/uL (ref 150–440)

## 2014-10-07 LAB — BASIC METABOLIC PANEL
Anion Gap: 6 — ABNORMAL LOW (ref 7–16)
BUN: 11 mg/dL (ref 7–18)
CO2: 29 mmol/L (ref 21–32)
Calcium, Total: 8 mg/dL — ABNORMAL LOW (ref 8.5–10.1)
Chloride: 105 mmol/L (ref 98–107)
Creatinine: 0.71 mg/dL (ref 0.60–1.30)
EGFR (African American): >60
EGFR (Non-African Amer.): >60
Glucose: 95 mg/dL (ref 65–99)
Osmolality: 279 (ref 275–301)
Potassium: 3.5 mmol/L (ref 3.5–5.1)
Sodium: 140 mmol/L (ref 136–145)

## 2014-10-07 LAB — CBC WITH DIFFERENTIAL/PLATELET
BASOS PCT: 1.3 %
Basophil #: 0.1 10*3/uL (ref 0.0–0.1)
Eosinophil #: 0.6 10*3/uL (ref 0.0–0.7)
Eosinophil %: 8 %
HCT: 28 % — ABNORMAL LOW (ref 35.0–47.0)
HGB: 9.5 g/dL — AB (ref 12.0–16.0)
LYMPHS ABS: 1 10*3/uL (ref 1.0–3.6)
LYMPHS PCT: 13.8 %
MCH: 36.8 pg — AB (ref 26.0–34.0)
MCHC: 34 g/dL (ref 32.0–36.0)
MCV: 108 fL — AB (ref 80–100)
MONO ABS: 0.5 x10 3/mm (ref 0.2–0.9)
MONOS PCT: 6.9 %
NEUTROS PCT: 70 %
Neutrophil #: 4.9 10*3/uL (ref 1.4–6.5)
Platelet: 301 10*3/uL (ref 150–440)
RBC: 2.58 10*6/uL — ABNORMAL LOW (ref 3.80–5.20)
RDW: 24.8 % — ABNORMAL HIGH (ref 11.5–14.5)
WBC: 7.1 10*3/uL (ref 3.6–11.0)

## 2014-10-08 LAB — CBC WITH DIFFERENTIAL/PLATELET
BASOS PCT: 1.3 %
Basophil #: 0.1 10*3/uL (ref 0.0–0.1)
Eosinophil #: 0.7 10*3/uL (ref 0.0–0.7)
Eosinophil %: 12.3 %
HCT: 26.5 % — ABNORMAL LOW (ref 35.0–47.0)
HGB: 8.9 g/dL — ABNORMAL LOW (ref 12.0–16.0)
LYMPHS ABS: 1.4 10*3/uL (ref 1.0–3.6)
LYMPHS PCT: 23.5 %
MCH: 37.2 pg — AB (ref 26.0–34.0)
MCHC: 33.4 g/dL (ref 32.0–36.0)
MCV: 111 fL — ABNORMAL HIGH (ref 80–100)
Monocyte #: 0.5 x10 3/mm (ref 0.2–0.9)
Monocyte %: 7.4 %
Neutrophil #: 3.4 10*3/uL (ref 1.4–6.5)
Neutrophil %: 55.5 %
Platelet: 299 10*3/uL (ref 150–440)
RBC: 2.38 10*6/uL — ABNORMAL LOW (ref 3.80–5.20)
RDW: 24.7 % — ABNORMAL HIGH (ref 11.5–14.5)
WBC: 6.1 10*3/uL (ref 3.6–11.0)

## 2014-10-08 LAB — BASIC METABOLIC PANEL
ANION GAP: 6 — AB (ref 7–16)
BUN: 12 mg/dL (ref 7–18)
CALCIUM: 7.9 mg/dL — AB (ref 8.5–10.1)
CHLORIDE: 107 mmol/L (ref 98–107)
CO2: 30 mmol/L (ref 21–32)
CREATININE: 0.78 mg/dL (ref 0.60–1.30)
EGFR (African American): >60
Glucose: 88 mg/dL (ref 65–99)
OSMOLALITY: 284 (ref 275–301)
Potassium: 3.6 mmol/L (ref 3.5–5.1)
SODIUM: 143 mmol/L (ref 136–145)

## 2014-10-10 LAB — URINE CULTURE

## 2014-10-29 ENCOUNTER — Ambulatory Visit: Payer: Self-pay | Admitting: Internal Medicine

## 2014-11-05 ENCOUNTER — Observation Stay: Payer: Self-pay | Admitting: Internal Medicine

## 2014-11-05 LAB — TROPONIN I
Troponin-I: 0.02 ng/mL
Troponin-I: 0.02 ng/mL

## 2014-11-05 LAB — COMPREHENSIVE METABOLIC PANEL
AST: 20 U/L (ref 15–37)
Albumin: 2.9 g/dL — ABNORMAL LOW (ref 3.4–5.0)
Alkaline Phosphatase: 146 U/L — ABNORMAL HIGH
Anion Gap: 5 — ABNORMAL LOW (ref 7–16)
BILIRUBIN TOTAL: 0.6 mg/dL (ref 0.2–1.0)
BUN: 12 mg/dL (ref 7–18)
CO2: 31 mmol/L (ref 21–32)
CREATININE: 0.75 mg/dL (ref 0.60–1.30)
Calcium, Total: 8.5 mg/dL (ref 8.5–10.1)
Chloride: 102 mmol/L (ref 98–107)
EGFR (African American): >60
EGFR (Non-African Amer.): >60
Glucose: 90 mg/dL (ref 65–99)
Osmolality: 275 (ref 275–301)
Potassium: 4 mmol/L (ref 3.5–5.1)
SGPT (ALT): 12 U/L — ABNORMAL LOW
Sodium: 138 mmol/L (ref 136–145)
TOTAL PROTEIN: 6 g/dL — AB (ref 6.4–8.2)

## 2014-11-05 LAB — CBC WITH DIFFERENTIAL/PLATELET
BASOS PCT: 1 %
Basophil #: 0.1 10*3/uL (ref 0.0–0.1)
Eosinophil #: 1.6 10*3/uL — ABNORMAL HIGH (ref 0.0–0.7)
Eosinophil %: 17.5 %
HCT: 38 % (ref 35.0–47.0)
HGB: 12.3 g/dL (ref 12.0–16.0)
LYMPHS ABS: 3.6 10*3/uL (ref 1.0–3.6)
Lymphocyte %: 37.7 %
MCH: 37 pg — ABNORMAL HIGH (ref 26.0–34.0)
MCHC: 32.4 g/dL (ref 32.0–36.0)
MCV: 114 fL — ABNORMAL HIGH (ref 80–100)
Monocyte #: 0.6 x10 3/mm (ref 0.2–0.9)
Monocyte %: 6.1 %
NEUTROS ABS: 3.6 10*3/uL (ref 1.4–6.5)
Neutrophil %: 37.7 %
PLATELETS: 298 10*3/uL (ref 150–440)
RBC: 3.32 10*6/uL — ABNORMAL LOW (ref 3.80–5.20)
RDW: 24.7 % — ABNORMAL HIGH (ref 11.5–14.5)
WBC: 9.4 10*3/uL (ref 3.6–11.0)

## 2014-11-05 LAB — URINALYSIS, COMPLETE
Bilirubin,UR: NEGATIVE
Blood: NEGATIVE
Glucose,UR: NEGATIVE mg/dL (ref 0–75)
KETONE: NEGATIVE
Leukocyte Esterase: NEGATIVE
NITRITE: NEGATIVE
PH: 5 (ref 4.5–8.0)
PROTEIN: NEGATIVE
RBC,UR: <1 /HPF (ref 0–5)
SPECIFIC GRAVITY: 1.012 (ref 1.003–1.030)
WBC UR: NONE SEEN /HPF (ref 0–5)

## 2014-11-05 LAB — CK-MB

## 2014-11-05 LAB — LIPASE, BLOOD: Lipase: 183 U/L (ref 73–393)

## 2014-11-05 LAB — DRUG SCREEN, URINE
Amphetamines, Ur Screen: NEGATIVE (ref ?–1000)
Barbiturates, Ur Screen: NEGATIVE (ref ?–200)
Benzodiazepine, Ur Scrn: POSITIVE (ref ?–200)
CANNABINOID 50 NG, UR ~~LOC~~: NEGATIVE (ref ?–50)
Cocaine Metabolite,Ur ~~LOC~~: NEGATIVE (ref ?–300)
MDMA (Ecstasy)Ur Screen: NEGATIVE (ref ?–500)
Methadone, Ur Screen: NEGATIVE (ref ?–300)
Opiate, Ur Screen: POSITIVE (ref ?–300)
PHENCYCLIDINE (PCP) UR S: NEGATIVE (ref ?–25)
Tricyclic, Ur Screen: NEGATIVE (ref ?–1000)

## 2014-11-05 LAB — SALICYLATE LEVEL: Salicylates, Serum: <1.7 mg/dL

## 2014-11-05 LAB — AMMONIA: Ammonia, Plasma: 32 mcmol/L (ref 11–32)

## 2014-11-05 LAB — ACETAMINOPHEN LEVEL: Acetaminophen: <2 ug/mL

## 2014-11-06 LAB — CBC WITH DIFFERENTIAL/PLATELET
BASOS PCT: 1.4 %
Basophil #: 0.1 10*3/uL (ref 0.0–0.1)
Eosinophil #: 1.2 10*3/uL — ABNORMAL HIGH (ref 0.0–0.7)
Eosinophil %: 23.3 %
HCT: 28.8 % — AB (ref 35.0–47.0)
HGB: 9.2 g/dL — ABNORMAL LOW (ref 12.0–16.0)
LYMPHS ABS: 1.6 10*3/uL (ref 1.0–3.6)
LYMPHS PCT: 31.9 %
MCH: 37.2 pg — AB (ref 26.0–34.0)
MCHC: 32.1 g/dL (ref 32.0–36.0)
MCV: 116 fL — ABNORMAL HIGH (ref 80–100)
Monocyte #: 0.3 x10 3/mm (ref 0.2–0.9)
Monocyte %: 6.7 %
NEUTROS ABS: 1.8 10*3/uL (ref 1.4–6.5)
Neutrophil %: 36.7 %
Platelet: 202 10*3/uL (ref 150–440)
RBC: 2.49 10*6/uL — ABNORMAL LOW (ref 3.80–5.20)
RDW: 24 % — AB (ref 11.5–14.5)
WBC: 5 10*3/uL (ref 3.6–11.0)

## 2014-11-06 LAB — COMPREHENSIVE METABOLIC PANEL
ALT: 7 U/L — AB
ANION GAP: 4 — AB (ref 7–16)
AST: 10 U/L — AB (ref 15–37)
Albumin: 2.3 g/dL — ABNORMAL LOW (ref 3.4–5.0)
Alkaline Phosphatase: 107 U/L
BUN: 9 mg/dL (ref 7–18)
Bilirubin,Total: 0.5 mg/dL (ref 0.2–1.0)
CALCIUM: 7.9 mg/dL — AB (ref 8.5–10.1)
CHLORIDE: 107 mmol/L (ref 98–107)
Co2: 30 mmol/L (ref 21–32)
Creatinine: 0.57 mg/dL — ABNORMAL LOW (ref 0.60–1.30)
EGFR (African American): >60
Glucose: 80 mg/dL (ref 65–99)
Osmolality: 279 (ref 275–301)
POTASSIUM: 3.8 mmol/L (ref 3.5–5.1)
SODIUM: 141 mmol/L (ref 136–145)
Total Protein: 4.5 g/dL — ABNORMAL LOW (ref 6.4–8.2)

## 2014-11-06 LAB — URINE CULTURE

## 2014-11-29 ENCOUNTER — Ambulatory Visit: Payer: Self-pay | Admitting: Internal Medicine

## 2015-03-21 NOTE — Op Note (Signed)
DATE OF BIRTH:  1923/10/23  DATE OF PROCEDURE:  12/24/2012  SURGEON:  [place]alyhad Paytyn Mesta, MD  ANESTHESIA:  General.   PREOPERATIVE DIAGNOSIS:  Left distal femoral periprosthetic fracture below a cephalomedullary nail.   POSTOPERATIVE DIAGNOSIS:  Left distal femoral periprosthetic fracture below a cephalomedullary nail.   PROCEDURE: 1.  ORIF of left distal femoral periprosthetic fracture.  2.  Hardware removal of left distal femoral distal locking screw from previous cephalomedullary nail placement.   IMPLANTS:  Synthes 8-hole distal femoral locking plate with associated cortical and locking screws.   FLUIDS:  Per anesthesia record.   BLOOD LOSS:  Per anesthesia record.   COMPLICATIONS:  None.   SPECIMENS:  None.   DRAINS:  One medium Hemovac.   INDICATIONS:  This is then 81 year old female, who fell approximately 3 years ago and sustained an intertrochanteric fracture. This was treated with a long cephalomedullary nail. On 12/23/2012, she fell once again and sustained injury to her distal femur. She was found to have a displaced fracture at the end of her nail. All risks, benefits, and alternatives of the procedure were explained at length to the patient and her family, and they wished to proceed with surgical intervention. Informed consent was obtained.   PROCEDURE IN DETAIL: The patient was seen in the preoperative holding area where the operative site was marked by an operative surgeon. Preoperative antibiotics were administered. The patient was taken to the operating room where she underwent initially attempted spinal anesthesia. This was unsuccessful, so she was therefore placed supine on the hospital on the operating room table where general anesthesia was gently induced. The left lower extremity was isolated. It was prepped and draped in a usual sterile manner, and a surgical timeout was performed.   To begin, we made an incision distally on the lateral aspect of the knee and dissected  bluntly down to the IT band. This was split in line with the incision, allowing us to encounter the fracture site. The fracture propagated up through the screw hole from the distal locking screw, and this was removed with an appropriate screwdriver. We then placed a Synthes distal femoral locking plate along the lateral aspect of the distal femur, and pulled some gentle traction. The length was restored. The plate was pinned with a guide pin placed through the locking guides about the distal screw holes. This was well fixed and seated along the distal femur. We made a second incision about the tip of the plate and secured the plate proximally, also with a guide pin. AP and lateral radiographs revealed appropriate placement of the hardware. The plate was placed slightly anterior to allow for placement of some bicortical nonlocking screws, in an attempt to suck down the plate to the bone and also provide some indirect reduction. A Verbrugge clamp was also utilized to help reduce the shaft of the femur down to the plate.   Once this was complete, the distal locking screws were drilled, and appropriate length screws were placed. Proximally, in the anterior holes on the plate, cortical screws of appropriate length were placed. On the more posterior screw holes, appropriate length locking screws were placed. AP and lateral radiographs of the shaft of the femur revealed excellent placement of the hardware. Distally, due to the patient's severe osteoporosis, as well as advanced degenerative changes, it was hard to discern fracture comminution and malalignment from the significant osteophyte formation that had resulted due to her degenerative changes. Initially, the patient's fracture was in some flexion, which  had been restored. However, there was a shell of bone anteriorly that was not captured by the locking screws, which was still in a bit of flexion. I therefore performed an arthrotomy to further define the extent of  this anterior fracture fragment. I was able to keyhole this thin piece of bone into its anatomic position with only a mild anterior step-off, which occurred just proximal to the articular surface at the trochlea. The flexion deformity was corrected. Decision was made not to attempt to secure this fragment with any further screw fixation, possibly even with a headless compression screw. However, due to the osteoporosis present, fixation of this fragment was not obtainable. I therefore once again ensured reduction of this anterior fragment by palpation.   The knee joint, as well as the wounds were then thoroughly irrigated with pulsatile lavage. The capsule was closed with #1 Vicryl. The IT band was closed with #1 Vicryl. The deep tissues were closed with 0 Vicryl. The skin was closed with 2-0 Vicryl, followed by staples. A medium Hemovac was placed. Sterile dressings were applied, as was a knee immobilizer. Drapes were removed. The patient was awakened from general anesthesia without complication and transported to the recovery room in stable condition.   POSTOPERATIVE PLAN:  The patient will be nonweightbearing to left lower extremity. She will keep the knee immobilizer on at all times. She will receive postoperative antibiotics and Lovenox for DVT prophylaxis. She will be seen and evaluated by physical therapy for mobility and transfers. However, she will not be placing any weight on the extremity for some time and will not be flexing the knee either to allow for the fracture, especially the anterior fragment, to heal appropriately and not place any flexion-type stress on this fragment. Care management will also be consulted for discharge planning.     ____________________________ [place] E. Katriana Dortch, MD ces:ms D: 12/24/2012 19:53:36 ET T: 12/24/2012 20:59:27 ET JOB#: [id number]  cc: [place] E. Latrisa Hellums, MD, <Dictator> [place] E Jesper Stirewalt MD ELECTRONICALLY SIGNED 01/19/2013 20:17

## 2015-03-21 NOTE — Consult Note (Signed)
PATIENT NAME:  Gwendolyn SawyerWOODY, [name] MR#:  [id number] DATE OF BIRTH:  1923-11-26  DATE OF CONSULTATION:  12/23/2012  REFERRING PHYSICIAN:  [place]had Smith, MD, Orthopedics CONSULTING PHYSICIAN:  Herschell Dimesichard J. Renae GlossWieting, MD PRIMARY CARE PHYSICIAN: Barbette ReichmannVishwanath Hande, MD   REASON FOR CONSULTATION: Preoperative management.   CHIEF COMPLAINT:  "I had a fall."   HISTORY OF PRESENT ILLNESS: This is an 81 year old female with hypertension, hyperlipidemia, CVA, eosinophilia, anemia and anxiety. She presents today after having a fall. She usually walks with a walker. She fell backwards and to the side. She is not the best historian and cannot tell me what really happened, but no loss of consciousness. Family who is with her right now were not present when the fall happened. The patient felt well prior to the fall and feels well now. She does have pain in the left knee area. In the ER, she was found to have a distal left femur fracture. Orthopedic was consulted for admission, and hospitalists were consulted for consultation. The patient is going to go to the operating room tomorrow.   PAST MEDICAL HISTORY: Hypertension, hyperlipidemia, CVA, eosinophilia, anemia, anxiety.   PAST SURGICAL HISTORY: Hysterectomy, hip fracture, appendectomy, tubal ligation.   ALLERGIES: SULFA, AMOXICILLIN AND SHELLFISH.   MEDICATIONS: Xanax 0.25 mg at bedtime, Norvasc 10 mg daily, Lasix 20 mg p.r.n., Hydrea 500 mg on Mondays and Wednesday alternating weeks, Nexium 40 mg daily, prednisone 10 mg Monday, Wednesday, Friday, Tylenol p.r.n. B12 monthly injections 1000 mcg, gabapentin 200 mg 3 times a day, metoprolol 75 mg b.i.d., folic acid 1 mg daily, aspirin 81 mg daily, nitrofurantoin 50 mg at bedtime, Pravastatin 20 mg daily, calcium and vitamin D daily, oxycodone/Tylenol 5/325 p.r.n.   SOCIAL HISTORY: She lives with family. No smoking. No alcohol. No drug use. 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   FAMILY HISTORY: Father died at  4487 of a heart attack, had hypertension. Mother died at 8347 of a brain tumor.    REVIEW OF SYSTEMS:  CONSTITUTIONAL: No fever, chills, or sweats. No weight loss. No weight gain. No weakness or fatigue.  EYES: She does wear glasses. Ears, nose, mouth, and throat: Decreased hearing. No sore throat. No difficulty swallowing.  CARDIOVASCULAR: No chest pain. No palpitations.  RESPIRATORY: No shortness of breath. No coughing. No sputum. No hemoptysis.  GASTROINTESTINAL: No nausea. No vomiting. No abdominal pain. No diarrhea. No constipation. No bright red blood per rectum. No melena.  GENITOURINARY: No burning on urination, no hematuria.  MUSCULOSKELETAL: Positive for left knee pain.  INTEGUMENT: Occasional rashes.  NEUROLOGICAL: No fainting or blackouts.  PSYCHIATRIC: No anxiety or depression.  ENDOCRINE: No thyroid problems.  HEMATOLOGIC/LYMPHATIC: No anemia.   PHYSICAL EXAMINATION: VITAL SIGNS: Temperature 97.8, pulse 79, respirations 18, blood pressure 127/71, pulse oximetry 96%.  GENERAL: No respiratory distress.  HEENT:  Eyes: Conjunctivae and lids normal. Pupils are equal, round, and reactive to light. Extraocular muscles are intact. No nystagmus. Ears, nose, mouth, and throat: Tympanic membranes no erythema. Nasal mucosa no erythema. Throat no erythema. No exudate seen. Lips and gums no lesions.  NECK: No JVD. No bruits. No lymphadenopathy. No thyromegaly. No thyroid nodules palpated.  LUNGS: Lungs are clear to auscultation. No use of accessory muscles to breathe. No rhonchi, rales, or wheeze heard.  CARDIOVASCULAR: S1, S2 normal. No gallops, rubs, or murmurs heard. Carotid upstroke 2+ bilaterally. No bruits.  EXTREMITIES: Dorsalis pedis pulses 2+ bilaterally. No edema of the lower extremity.  ABDOMEN: Soft, nontender. No  organomegaly/splenomegaly. Normoactive bowel sounds. No masses felt.  LYMPHATIC: No lymph nodes in the neck.   MUSCULOSKELETAL: Positive for left knee swelling.  SKIN:  Bruising over the right shin.  NEUROLOGICAL: Cranial nerves II through XII grossly intact. Did not test deep tendon reflexes secondary to distal femur fracture.  PSYCHIATRIC: The patient is oriented to person, place, and time.   LABORATORY AND RADIOLOGICAL DATA:  CT scan of the head showed no acute intracranial abnormality. Left knee, complete, showed a fracture of the distal left femur. Pelvis x-ray negative. EKG: Normal sinus rhythm, 68 beats per minute, left ventricular hypertrophy.   White blood cell count 13.0, hemoglobin and hematocrit 10.7 and 33.0, platelet count of 376, glucose 122, BUN 16, creatinine 0.87, sodium 140, potassium 3.9, chloride 104, CO2 29, calcium 8.4. Liver function tests normal range. INR 1.0.   ASSESSMENT AND PLAN: 1. Preop consultation for distal femur fracture: I see no contraindications to surgery at this time. Surgery must done on a weight-bearing bone in order for the patient to walk again to avoid numerous complications that can happen if the patient is not ambulatory, such as pneumonia, bronchitis, skin breakdown and DVT. The patient is already on a preop beta blocker, Toprol. Since the patient is on chronic steroids, I will give preop stress dose steroids 40 mg IV every 8 hours for 3 doses.  2. Hypertension: I will continue Norvasc and Toprol.  3. Hyperlipidemia: Continue Pravastatin.  4. Eosinophilia: She is on Hydrea and prednisone.  5. B12 deficiency: Gets monthly B12 injections.  6. Anemia: Watch closely after surgery. Blood loss could happen with this.   CODE STATUS:  The patient is a FULL CODE.     TIME SPENT ON CONSULTATION:  55 minutes.  ____________________________ Herschell Dimes. Renae Gloss, MD rjw:cb D: 12/23/2012 20:14:18 ET T: 12/24/2012 08:11:48 ET JOB#: [id number] cc: Herschell Dimes. Renae Gloss, MD, <Dictator> [place] E. Smith, MD Barbette Reichmann, MD Salley Scarlet MD ELECTRONICALLY SIGNED 12/29/2012 15:59

## 2015-03-21 NOTE — H&P (Signed)
PATIENT NAME:  Gwendolyn Richard, Gwendolyn Richard MR#:  [id number] DATE OF BIRTH:  [birth year]  DATE OF ADMISSION:  12/23/2012  CHIEF COMPLAINT:  Left knee pain.   HISTORY OF PRESENT ILLNESS:  This is an 81 year old female with a history of intramedullary nailing of the left femur for treatment of an intertrochanteric fracture.  The patient fell earlier today and sustained injury to her knee.  She presented to the Emergency Department complaining of pain in the left knee rated as a VII/X, sharp in nature, exacerbated by movement of the leg and relieved by rest.  The patient had no other injuries to the remaining extremities.    PAST MEDICAL HISTORY:  Hypertension, hypothyroidism, history of a stroke.  PAST SURGICAL HISTORY:  Left femur cephalomedullary nailing, hysterectomy, appendectomy.   MEDICATIONS:  Vitamin B12, pravastatin, Norvasc, Nexium, Neurontin, metoprolol, hydroxyurea, Lasix, folic acid, calcium, Benadryl, alprazolam, Tylenol.   ALLERGIES:  AMOXICILLIN, SULFA, SHELLFISH.  SOCIAL HISTORY:  The patient denies any alcohol or tobacco use.   FAMILY HISTORY:  Noncontributory.   REVIEW OF SYSTEMS:  No chest pain.  No shortness of breath.   PHYSICAL EXAMINATION:  GENERAL:  The patient well-appearing, well-nourished, no acute distress.  EXTREMITIES:  Examination of the left leg reveals overlying skin to be intact without erythema.  There is significant effusion within the left knee.  She has tenderness to palpation about the distal femur.  She has good range of motion of the ankle, Richard/Richard strength on dorsiflexion, plantarflexion and EHL.  Intact sensation distally.  Good distal pulses.  No gross instability noted.  Examination of the right lower extremity is performed in a similar manner, is free of any abnormalities.   IMAGES:  X-rays of the left femur and knee were obtained and reviewed which reveals evidence of a left distal femoral periprosthetic supracondylar fracture.   IMPRESSION:  The patient is  an 81 year old female with left distal femoral periprosthetic fracture.   PLAN:  The patient will be admitted to the hospital.  She will undergo a medicine consult tonight.  She will be nothing by mouth for surgical intervention in the form of ORIF of the left distal femur.      ____________________________ [place] E. Tawney Vanorman, MD ces:ea D: 12/23/2012 23:20:41 ET T: 12/24/2012 04:26:22 ET JOB#: [id number]  cc: [place]had E. Dreydon Cardenas, MD, <Dictator> [place] E Tenessa Marsee MD ELECTRONICALLY SIGNED 12/24/2012 7:13

## 2015-03-21 NOTE — Discharge Summary (Signed)
PATIENT NAME:  Gwendolyn Richard, Gwendolyn Richard MR#:  [id number] DATE OF BIRTH:  [birth year]  DATE OF ADMISSION:  12/23/2012 DATE OF DISCHARGE:  12/28/2012  ADMITTING DIAGNOSIS: Left distal femoral periprosthetic fracture below a cephalomedullary nail.   DISCHARGE DIAGNOSIS: Left distal femoral periprosthetic fracture below a cephalomedullary nail.   PROCEDURES:  1. ORIF of distal femoral periprosthetic fracture.  2. Hardware removal of left distal, femoral distal locking screw from previous cephalomedullary nail placement.    IMPLANTS: Synthes 8 hole distal femoral locking plate with associated cortical and locking screws.   FLUIDS: Per anesthesia record.   BLOOD LOSS: Per anesthesia record.   COMPLICATIONS: None.   SPECIMENS: None.  HISTORY: The patient is an 81 year old female with a history of intramedullary nailing of the left femur for treatment of an intertrochanteric fracture. The patient fell earlier on 12/23/2012 and sustained injury to her knee. She presented to the ER complaining of pain in the left knee, rated as an 8 out 10. It was sharp in nature and was exacerbated by movement of the leg and relieved by rest. The patient had no other injuries to the remaining extremities.   PHYSICAL EXAMINATION: GENERAL: The patient is well appearing, well nourished, in no acute distress.  EXTREMITIES: Examination of the left leg reveals overlying skin to be intact without erythema. There is significant effusion with the left knee. She has tenderness to palpation about the distal femur. She has good range of motion of the ankle. She has 5 out of 5 strength on dorsiflexion, plantar flexion and extensor hallux longus. She has intact sensation distally. She has good distal pulses with no gross instability noted. Examination of the right lower extremity is performed, in a similar manner, and is free of any abnormalities.   HOSPITAL COURSE: The patient was admitted to the hospital on 12/23/2012 after the  patient was evaluated by [facility] and noted to be stable for surgery. On 12/24/2012, the patient had surgery that same day. She was brought to the orthopedic floor from the PAC-U in stable condition. On 12/25/2012, the patient was stable. The patient's blood pressure was under control. She did have some acute postop blood loss anemia. On 12/26/2012, the patient had a drop in her hemoglobin to 5.7. She was transfused 1 unit of packed red blood cells. On 12/27/2012, the patient's hemoglobin was 6.7 so a second unit of packed red blood cells was transfused to the patient. On January 28th and January 29th the patient had slow progress with physical therapy. Her vital signs did remain stable and she had no complications. On 12/28/2012, the patient's hemoglobin was up to 8.9, her vital signs were stable and she was doing well. The patient's pain was controlled. She did note to have some increasing urinary frequency which urinalysis showed a urinary tract infection. The patient was started on Cipro 250 mg 2 times a day for this. On 12/28/2012, the patient was stable and ready for discharge to a rehab facility for continuation of physical therapy and occupational therapy.   DISCHARGE INSTRUCTIONS: The patient does not need to put any weight on the effected extremity. She is to elevate the effected foot and leg on 1 or 2 pillows with the foot higher than the knee. She needs to wear thigh-high TED hose on both legs and remove 1 hour per 8 hour shift. She needs to keep the knee in an immobilizer except for when in bed. She may resume a regular diet as tolerated. She needs to apply  an ice pack to the effected area and do not get the dressing or bandage wet or dirty. [facility] if the dressing gets water under it. Leave the dressing on. Remove staples and apply benzoin and Steri-Strips in 2 weeks.   Symptoms to report: [facility] if any of the following occur: Bright red bleeding  from the incision or wound, fever above 101.5 degrees, redness, swelling or drainage at the incision. [facility] if you experience any increased leg pain, numbness or weakness in your legs or bowel or bladder symptoms.   She will follow up with outpatient physical therapy and occupational therapy at rehab facility. She will [facility] in 2 weeks for staple removal.   DISCHARGE MEDICATIONS: 1. Norvasc 10 mg oral tablet 1 tablet orally once a day. 2. Calcium with vitamin D 600 mg/125 units oral tablet 1 tablet orally once a day. 3. Prednisone 10 mg oral tablet taken as directed, 1 on Monday, Wednesday and Friday. 4. Alprazolam 0.5 mg oral tablet 1 orally once a day at bedtime.  5. Metoprolol succinate 50 mg oral tablet extended release 1-1/2 tabs 2 times a day. 6. Folic acid 1 mg oral tablet 1 orally once a day.  7. Pravastatin 20 mg oral tablet 1 orally once a day. 8. Furosemide 20 mg oral tablet 1 tablet orally once a day as needed. 9. Nexium 40 mg one once a day. 10. Hydroxyurea 500 mg oral capsule 1 cap orally on Monday and Wednesday every other week alternating with 2 caps Monday and Wednesday every other week.  11. Neurontin 100 mg oral capsule 2 caps orally 3 times a day. 12. Vitamin B12 1000 mcg/mL injectable solution 1 once a month. 13. Benadryl dye-free allergic 25 mg oral tablet 1 tablet orally 2 times a day as needed.  14. Vicodin 5/325 mg 1 orally every 4 to 6 hours as needed for pain.  15. Tylenol 325 mg oral tablet 1 tablet orally every 4 hours as needed for pain or temp greater than 100.4.  16. Magnesium hydroxide 8% oral suspension 30 mL orally 2 times a day as needed for constipation.  17. Lovenox 40 mg subcutaneous once a day for 14 days. 18. Bisacodyl 10 mg rectal suppository 1 suppository rectally once as needed.  19. Docusate calcium 240 mg oral capsule 1 cap orally once a day.  20. Pantoprazole 40 mg oral  delayed-release capsule 1 tablet orally once a day. 21. Ciprofloxacin 250 mg oral tablet 1 tablet orally every 12 hours for 5 days.  ____________________________ Evon Slackhomas C. Gaines, PA-C tcg:sb D: 12/28/2012 10:52:36 ET T: 12/28/2012 11:25:26 ET JOB#: [id number]  cc: Evon Slackhomas C. Gaines, PA-C, <Dictator> Evon SlackHOMAS C GAINES GeorgiaPA ELECTRONICALLY SIGNED 01/02/2013 10:38

## 2015-03-22 NOTE — Discharge Summary (Signed)
PATIENT NAME:  Gwendolyn Richard, Melisa V MR#:  [id number] DATE OF BIRTH:  [birth year]  DISCHARGE DIAGNOSES:  1.  Unresponsiveness with altered mental status, most likely secondary to multiple medications.  2.  Dementia.  3.  History of hypertension.  4.  History of recent tibial plateau fracture.   CHIEF COMPLAINT: Unresponsiveness.   HISTORY OF PRESENT ILLNESS: Gwendolyn Richard is an 81 year old female who had recently sustained a fall with left tibial plateau fracture and had been admitted to rehabilitation  and subsequently went home for with hospice care. Presented to the ED after she was found to be unresponsive. The patient reportedly was able to open her eyes, but unable to speak and speech subsequently became somewhat garbled and was noted to have pinpoint pupils. Her condition improved after she was given 1 mg of Narcan IV.   PAST MEDICAL HISTORY: Significant for osteoporosis, previous stroke, history of hemochromatosis, hypothyroidism, B12 deficiency, hypertension, anxiety, insomnia, previous appendectomy, hysterectomy, left tibial plateau fracture, dementia, ataxia, osteoarthritis.   HOSPITAL COURSE: The patient was [facility] and received intravenous hydration.   LABORATORY DATA: Showed lipase of 183, albumin of 2.9, alkaline phosphatase 146. Troponin less than 0.02. Hemoglobin is 12.3, WBC count of 9.4, platelet 298,000. Acetaminophen level was less than 2. Salicylate level less than 1.7. ABG showed pH of 7.4, pCO2 of 49, pO2 of 83 and O2 saturation 100%.  IMAGING: Chest x-ray showed cardiomegaly with hilar prominence. No segmental consolidation, evidence for thoracic kyphosis, as well as wedge compression deformities with loss of height was noted at L1 vertebra. CT scan of the head without contrast did not show any acute intracranial abnormality. EKG showed sinus rhythm with occasional PVCs, nonspecific T wave abnormalities and no acute ST-T changes.   The patient's Benadryl was held.  She was advised to hold off on trazodone and Haldol and mental status gradually improved. Family was keen to take her back home and she was discharged in stable condition on the following medications: Hydroxyurea 500 mg capsules 2 capsules twice a week on Mondays and Wednesdays, and 1 capsule alternating with 2 capsules 2 times week.  Calcium plus vitamin D 1 tablet once a day, folic acid 1 mg once a day, metoprolol tartrate 50 mg 1.5 tablets b.i.d., gabapentin 100 mg t.i.d., amlodipine 5 mg once a day, vitamin B12 injection once a month, Nexium 40 mg a day, pravastatin 20 mg a day, magnesium hydroxide gel 30 mL b.i.d. as needed, bisacodyl 10 mg rectal suppository 1 suppository once a day, alprazolam 0.25 mg p.o. b.i.d. p.r.n., hydrocodone 5/325 every 8 hours as needed for pain.   The family has been advised to call back with any questions or concerns. Hospice services will be resumed and the patient was discharged in stable condition.   TOTAL TIME SPENT ON DISCHARGING THIS PATIENT: 35 minutes.   ____________________________ Barbette ReichmannVishwanath Zohan Shiflet, MD vh:LT D: 11/06/2014 14:45:52 ET T: 11/06/2014 18:58:54 ET JOB#: [id number]  cc: Barbette ReichmannVishwanath Nayali Talerico, MD, <Dictator> Barbette ReichmannVISHWANATH Lilliann Rossetti MD ELECTRONICALLY SIGNED 11/19/2014 13:41

## 2015-03-22 NOTE — Discharge Summary (Signed)
PATIENT NAME:  Gwendolyn SawyerWOODY, Tamber V MR#:  [id number] DATE OF BIRTH:  08-01-1923  DATE OF ADMISSION:  10/03/2014 DATE OF DISCHARGE:  10/08/2014  ADMITTING DIAGNOSIS: Left tibial plateau fracture.   DISCHARGE DIAGNOSIS: Left tibial plateau fracture.   HISTORY: [name] is an 81 year old female who sustained a fall on 10/03/2014 when her knee gave out. [facility] and found to have left tibial plateau fracture. Orthopedics was consulted. No surgery was recommended. Postoperative hinged knee brace locked in extension, nonweightbearing left lower extremity.   HOSPITAL COURSE: After initial admission on 10/03/2014, orthopedics was consulted. Medicine was also consulted as she had multiple medical problems. Her hospital course was complicated by high white blood cells and anemia. She did require transfusion of 1 unit packed red blood cells. She was stable and ready for discharge on 10/07/2014; however, medicine suspended the discharge order because she was hypotensive. On October 08, 2014, she was stable and ready for discharge.   CONDITION AT DISCHARGE: Stable.   DISPOSITION: The patient was sent to rehab.   DISCHARGE INSTRUCTIONS: Continue postoperative hinged knee brace locked in extension. May take off for hygiene. Continue Polar Care to left knee at all times. Nonweightbearing left lower extremity. Physical therapy will continue. Regular diet. TED hose knee-high bilaterally. [facility]oped[facility] in 7 to 10 days for repeat x-rays with left knee out of brace. Follow up with medicine as necessary.  ____________________________ Yezenia Fredrick M. Haskel KhanBerndt, NP amb:sb D: 10/08/2014 15:36:31 ET T: 10/08/2014 16:01:15 ET JOB#: [id number]  cc: Jolly Bleicher M. Haskel KhanBerndt, NP, <Dictator> Burt EkAPRIL M Arch Methot FNP ELECTRONICALLY SIGNED 10/10/2014 7:54

## 2015-03-22 NOTE — Consult Note (Signed)
PATIENT NAME:  Gwendolyn Richard, Gwendolyn Richard MR#:  [id number] DATE OF BIRTH:  07/03/1923  DATE OF CONSULTATION:  10/03/2014  REFERRING PHYSICIAN:   CONSULTING PHYSICIAN:  Katha HammingSnehalatha Areya Lemmerman, MD  PRIMARY DOCTOR:  Dr. Marcello FennelHande.   EMERGENCY ROOM PHYSICIAN:  Dr. Mindi JunkerGottlieb.    REASON FOR CONSULT: Medical management.    HISTORY OF PRESENT ILLNESS:  The patient is an 81 year old female with left tibia fracture after a fall. She lives at home with her daughter, she woke up to go to the bathroom at the bedside commode. She felt she lost balance and then she had a fall.  The patient hit her head on the right side, then the patient also suffered significant pain in the left leg. The patient brought into the Emergency Room for further evaluation. The patient did not lose consciousness at that time. The patient unable to bear weight onto the left leg. Noticed to have a comminuted impacted fracture of the proximal tibia metaphysis and it extended to medial and lateral tibial plateau.  The patient has severe osteoarthritis and demineralization and moderate joint effusion. The patient has significant pain in the leg right now. She is alert and oriented, denies any other complaints.   PAST MEDICAL HISTORY: Significant for history of hypertension, the patient had a left distal femoral > fracture repaired in January of 2014, the patient has a history of B12 deficiency, history of heterogenous hemochromatosis, hypothyroidism, and history of stroke.   PAST SURGICAL HISTORY: Significant for history of left femur surgery, hysterectomy, appendectomy.    ALLERGIES:  AMOXICILLIN, SULFA, AND SHELLFISH.    SOCIAL HISTORY: No smoking, no drinking. Lives with the daughter.   FAMILY HISTORY: No hypertension or diabetes.   MEDICATIONS: Neurontin 100 mg p.o. b.i.d. 1 in the morning and 1 in the 3:00, hydrocodone with acetaminophen 5/325 one tablet every 8 hours as needed for pain, hydroxyurea 500 mg the patient takes 1 capsule Monday,  Wednesday every other week alternating with 2 capsules Monday and Wednesday every other week, metoprolol 50 mg p.o. 1-1/2 tablets p.o. b.i.d., Nexium 40 mg p.o. daily, pravastatin 20 mg p.o. daily, cyanocobalamin 1000 mcg injection once a month, folic acid 1 mg daily, furosemide 20 mg p.o. daily, calcium carbonate with vitamin 600/200 one tablet by mouth daily, the patient also is on Xanax 0.25 mg daily as needed, amlodipine 5 mg as needed.   REVIEW OF SYSTEMS:   CONSTITUTIONAL:  No fever. No fatigue.  HEENT:  Eyes, no blurred vision. Noted contusion in the right frontal area. Respirations, no cough. No wheezing. ENT, no tinnitus. No epistaxis. The patient has no difficulty swallowing.  A little hard of hearing.  CARDIOVASCULAR: No chest pain.  No orthopnea.  No palpitations.   RESPIRATORY:  No cough. No trouble breathing.  GENITOURINARY:  No dysuria.  GASTROINTESTINAL: No nausea. No vomiting. No diarrhea. No abdominal pain.  HEMATOLOGIC: The patient has a history of anemia.  MUSCULOSKELETAL: The left jaw and left leg pain now and has ecchymosis in the left leg.  NEUROLOGIC: No numbness or weakness.   PHYSICAL EXAMINATION:  VITAL SIGNS: Temperature is 97.3, heart rate 79, blood pressure 108/78, saturation 97% on room air.  GENERAL: The patient is alert and is oriented, elderly female, noted to have a contusion on the left forehead. She is able to answer questions appropriately.   EYES: Pupils equal, reacting to light. No conjunctival pallor. No scleral icterus.  NOSE: No nasal lesions. No drainage.  EARS: No drainage or external lesions.  MOUTH: No lesions are noted.  NECK: Supple. No JVD. No carotid bruits.   RESPIRATORY:  Good respiratory effort. Clear to auscultation. No wheeze. No rales.  CARDIOVASCULAR: S1, S2, regular. No murmurs. PMI not displaced. The patient has diffuse scaling of the left leg with ecchymosis.  ABDOMEN: Soft, nontender, nondistended. Bowel sounds present.   MUSCULOSKELETAL: Able to move her right upper and lower extremities.  On left side the patient has significant pain in the left leg, unable to move much.   NEUROLOGICAL:  Cranial nerves II-XII are intact. Power 5/5 in the upper and lower on the right side, left side upper extremity power is 5/5, lower extremity unable to do because of this acute tibial fracture. PSYCHIATRIC: Motor and affect are within normal limits.   LABORATORY DATA:  1.  CT of the abdomen shows no definite acute abnormalities. Multiple spinal compression fractures are present which are old.  2.  Chest x-ray shows left greater than the right bibasilar atelectasis, suboptimal inspiration, no acute cardiopulmonary abnormality.  3.  CT head shows no acute intracranial abnormalities, right frontal scalp hematoma present without any fractures of skull.   4.  X-rays of the left knee  shows fracture of proximal tibial metaphysis 5.  Electrolytes, sodium is 141, potassium 4, chloride is 105, bicarbonate 29, BUN 16, creatinine 0.92, glucose 134, WBC 13.9, hemoglobin 9, hematocrit 28, platelets 283,000. INR is 1.1.  6.  EKG, atrial fibrillation,80 bpm the by ekg patient's rhythm looks like she is in normal sinus rhythm at 80 beats a minute    ASSESSMENT AND PLAN:   1.  The patient is an 81 year old female with a fall and suffered a left tibial fracture, which is comminuted left tibial fracture. The patient is hemodynamically stable at this time.  and we are going to repeat another EKGfor follow of Afib.  Clinically and on the monitor she lere as atrial fibrillation, so we will confirm the EKG findings, but the patient is at moderate risk for surgery because of advanced age and hypertension, multiple medical other problems, but surgery can be performed.  The patient can use incentive spirometer and use beta blockers. She has hypertension, she can continue Norvasc and Toprol, but hold the Lasix as clinically dehydrated. Ecchymosis in the  left leg is secondary to fall and then the fracture. At this time she is hemodynamically stable. The patient needs blood transfusion standby, but at this time hemoglobin is nine, so she will need transfusion if she gets any blood loss during the surgery.  The patient's hemoglobin was done November 2 at cancer center, is 12.6.  2.  The patient has a history of heterogenous hemochromatosis, heterozygous, sees Dr. Lorre Nick as an outpatient.  She has a history of B12 deficiency and folic acid deficiency, B12 and folic acid can be continued.  3.  Hyperlipidemia. Continue pravastatin.   4.  The patient has a history of eosinophilia, she is on Hydrea, that can be continued.  5.  The patient is stable for surgery.   TIME SPENT: About 55 minutes.     ____________________________ Katha Hamming, MD sk:bu D: 10/03/2014 17:06:51 ET T: 10/03/2014 18:12:58 ET JOB#: [id number]  cc: Katha Hamming, MD, <Dictator> Katha Hamming MD ELECTRONICALLY SIGNED 11/10/2014 23:00

## 2015-03-22 NOTE — H&P (Signed)
Subjective/Chief Complaint Left knee pain   History of Present Illness 81 year old female found on the floor by her daughter today. According to the patient the knee "gave way" and she fell. She complained of left knee pain and was noted to have significant swelling and ecchymosis to the left knee and lower leg. Prior to theto the fall she was ambulating with a walker.   Past Med/Surgical Hx:  Hypertension:   Gastric ulcers:   Ataxia:   Osteoporosis:   Eosinophilia:   Dementia:   Hypothyroidism:   CVA/Stroke:   Osteoarthritis:   HTN:   ORIF left distal femur fracture:   ORIF left hip fracture:   Appendectomy:   Hysterectomy - Total:   ALLERGIES:  Sulfa drugs: Unknown  Shellfish: Unknown  Amoxicillin: Unknown  HOME MEDICATIONS: Medication Instructions Status  Calcium 600+D 600 mg-200 units oral tablet 1 tab(s) orally once a day Active  folic acid 1 mg oral tablet 1 tab(s) orally once a day Active  metoprolol tartrate 50 mg oral tablet 1.5 tab(s) orally 2 times a day Active  gabapentin 100 mg oral capsule 2 cap(s) orally once a day (in the morning) Active  gabapentin 100 mg oral capsule 1 cap(s) orally once a day at noon Active  gabapentin 100 mg oral capsule 3 cap(s) orally 3 times a day Active  amLODIPine 5 mg oral tablet 1 tab(s) orally once a day Active  traZODone 50 mg oral tablet 0.5 tab(s) orally once a day (at bedtime) Active  cyanocobalamin 1000 mcg/mL injectable solution 1 milliliter(s) injectable once a month Active  NexIUM 40 mg oral delayed release capsule 1 cap(s) orally once a day Active  pravastatin 20 mg oral tablet 1 tab(s) orally once a day (at bedtime) Active  hydroxyurea 500 mg oral capsule 1 cap(s) orally 2 times a week on monday and wednesday every other week alternating with 2 capsules 2 times a week Active  hydroxyurea 500 mg oral capsule 2 cap(s) orally 2 times a week on monday and wednesday every other week alternating with 1 capsule 2 times a week  Active  acetaminophen-HYDROcodone 325 mg-5 mg oral tablet 1 tab(s) orally every 8 hours, As Needed - for Pain Active  ALPRAZolam 0.25 mg oral tablet 1 tab(s) orally 2 times a day, As Needed Active  furosemide 20 mg oral tablet 1 tab(s) orally once a day Active   Family and Social History:  Family History Coronary Artery Disease  Hypertension  brain tumor (mother)   Social History negative tobacco, negative ETOH, negative Illicit drugs   Place of Living Home   Review of Systems:  Fever/Chills No   Cough No   Sputum No   Abdominal Pain No   Diarrhea No   Constipation No   Nausea/Vomiting No   SOB/DOE No   Chest Pain No   Dysuria No   Physical Exam:  GEN well developed, well nourished, no acute distress   HEENT PERRL, hearing intact to voice, Oropharynx clear   NECK supple  No masses   RESP normal resp effort  clear BS  no use of accessory muscles   CARD regular rate  no murmur  No LE edema  no JVD   ABD denies tenderness  soft   EXTR Large amount of ecchymosis and swelling to left lower extremity. positive knee effusion. Tender to palation along the medial aspect of the proximal tibia. Pain with attempted range of motion of the left knee   NEURO motor/sensory function  intact   PSYCH alert, poor insight   Lab Results: Routine Chem:  05-Nov-15 12:36   Glucose, Serum  134  BUN 16  Creatinine (comp) 0.92  Sodium, Serum 141  Potassium, Serum 4.0  Chloride, Serum 105  CO2, Serum 29  Calcium (Total), Serum  8.2  Anion Gap 7  Osmolality (calc) 284  eGFR (African American) >60  eGFR (Non-African American) >60 (eGFR values <2m/min/1.73 m2 may be an indication of chronic kidney disease (CKD). Calculated eGFR, using the MRDR Study equation, is useful in  patients with stable renal function. The eGFR calculation will not be reliable in acutely ill patients when serum creatinine is changing rapidly. It is not useful in patients on dialysis. The eGFR  calculation may not be applicable to patients at the low and high extremes of body sizes, pregnant women, and vegetarians.)  Result Comment cbc - RESULTS VERIFIED BY REPEAT TESTING.  - PATHOLOGIST TO REVIEW SMEAR. COMMENTS  - APPEAR ON REPORT WHEN COMPLETE.  Result(s) reported on 03 Oct 2014 at 02:53PM.  Routine Coag:  05-Nov-15 12:36   Prothrombin 13.9  INR 1.1 (INR reference interval applies to patients on anticoagulant therapy. A single INR therapeutic range for coumarins is not optimal for all indications; however, the suggested range for most indications is 2.0 - 3.0. Exceptions to the INR Reference Range may include: Prosthetic heart valves, acute myocardial infarction, prevention of myocardial infarction, and combinations of aspirin and anticoagulant. The need for a higher or lower target INR must be assessed individually. Reference: The Pharmacology and Management of the Vitamin K  antagonists: the seventh ACCP Conference on Antithrombotic and Thrombolytic Therapy. CCVELF.8101Sept:126 (3suppl): [id number] A HCT value >55% may artifactually increase the PT.  In one study,  the increase was an average of 25%. Reference:  "Effect on Routine and Special Coagulation Testing Values of Citrate Anticoagulant Adjustment in Patients with High HCT Values." American Journal of Clinical Pathology 2006;126:400-405.)  Activated PTT (APTT) 25.4 (A HCT value >55% may artifactually increase the APTT. In one study, the increase was an average of 19%. Reference: "Effect on Routine and Special Coagulation Testing Values of Citrate Anticoagulant Adjustment in Patients with High HCT Values." American Journal of Clinical Pathology 2006;126:400-405.)  Routine Hem:  05-Nov-15 12:36   Hemoglobin (CBC)  9.0  WBC (CBC)  13.9  RBC (CBC)  2.33  Hematocrit (CBC)  28.0  Platelet Count (CBC) 283  MCV  120  MCH  38.5  MCHC 32.1  RDW  19.6  Bands 9  Segmented Neutrophils 51  Lymphocytes 26   Monocytes 10  Eosinophil 4  NRBC 6  Diff Comment 1 ANISOCYTOSIS  Diff Comment 2 PLTS VARIED IN SIZE  Result(s) reported on 03 Oct 2014 at 02:53PM.   Radiology Results: XRay:    05-Nov-15 13:29, Knee Left Complete  Knee Left Complete  REASON FOR EXAM:    fall, pain/swelling  COMMENTS:       PROCEDURE: DXR - DXR KNEE LT COMP WITH OBLIQUES  - Oct 03 2014  1:29PM     CLINICAL DATA:  FGolden Circlein her day and at home earlier this morning and  injured the left lower leg and knee. Initial encounter. Prior ORIF  of remote left femur fractures.    EXAM:  LEFT KNEE - COMPLETE 4+ VIEW    COMPARISON:  Left tibia fibula x-rays obtained concurrently.    FINDINGS:  Comminuted impacted fracture involving the proximal tibial  metaphysis. The fracture extends to the medial  and lateral tibial  plateaus, but there does not appear to be a free bone fragment.  Severe osseous demineralization and sarcopenia. Prior ORIF of a  distal left femur fracture with healing.    Complete loss of the joint spacein the lateral compartment. Severe  medial compartment joint space narrowing. Moderate joint effusion.  Calcified loose bodies with in the joint. Ossification within the  patellar and quadriceps tendons. Extensive femoropopliteal and  tibioperoneal atherosclerosis.     IMPRESSION:  1. Comminuted impacted fracture involving the proximal tibial  metaphysis. The fracture extends to the medial and lateral tibial  plateaus.  2. Severe osteoarthritis.  3. Severe osseous demineralization.  4. Moderate-sized joint effusion. Calcified loose bodies within the  joint.      Electronically Signed    By: Evangeline Dakin M.D.    On: 10/03/2014 13:50         Verified By: Deniece Portela, M.D.,    Assessment/Admission Diagnosis Left tibial plateau fracture DJD of the left knee status post ORIF of left distal femur fracture   Plan Findings were discussed with the patient's family. I have recommended  immobilization in a knee brace with NWB to the LLE. Will admit for elevation, pain control, and cold therapy.  Care management and social work consulted.   Electronic Signatures: Dereck Leep (MD)  (Signed [phone] 00:31)  Authored: CHIEF COMPLAINT and HISTORY, PAST MEDICAL/SURGIAL HISTORY, ALLERGIES, HOME MEDICATIONS, FAMILY AND SOCIAL HISTORY, REVIEW OF SYSTEMS, PHYSICAL EXAM, LABS, Radiology, ASSESSMENT AND PLAN   Last Updated: 06-Nov-15 00:31 by Dereck Leep (MD)

## 2015-03-22 NOTE — Consult Note (Signed)
Brief Consult Note: Diagnosis: 81 yr  old female with fall at home.suffered left tibial fracture.pt lost balance and fell at home last night.no dizziness,no chest pain.   Patient was seen by consultant.   Consult note dictated.   Recommend to proceed with surgery or procedure.   Comments: 81 yr old female with left tibia fracture(proximal)with  echymosis:pt has h/o osteoporosis,previous left hip surgery in 2014 ;has h/o htn. [facility], will follow along; htn;controlled;continue metoprolol,can hold lasix as she looks dehydrated,can resume in 1-2 days GERD:continue PPI HLP;continue statuins insomnia:recentl;y started on trazodone 25 mg by pcp:on tuesday.  Electronic Signatures: Katha HammingKonidena, Vayla Wilhelmi (MD)  (Signed [phone]-Nov-15 16:56)  Authored: Brief Consult Note   Last Updated: 05-Nov-15 16:56 by Katha HammingKonidena, Royer Cristobal (MD)

## 2015-03-26 NOTE — H&P (Signed)
PATIENT NAME:  Gwendolyn Richard, Gwendolyn Richard MR#:  [id number] DATE OF BIRTH:  1923/09/12  DATE OF ADMISSION:  11/05/2014  PRIMARY CARE PHYSICIAN: Barbette Reichmann, MD  HISTORY OF PRESENT ILLNESS: The patient is an 81 year old Caucasian female with a past medical history significant for history of recent admission in 09/2014 with left tibial plateau fracture, which did not require any surgical intervention. The patient was discharged with a brace to a rehabilitation facility, and left back home on Thanksgiving Day.   She presents today with unresponsiveness. Apparently, her daughter brought her in because she found her at around 7:00 a.m. unresponsive. They were checking on her all night long, but she was asleep. Today, however, her diaper was supposed to be changed; however, the patient would not help her daughter. The patient's daughter tried to wake her up; however, she would not respond. She was breathing and she was live. She was able to open her eyes; however, otherwise, was not able to speak. Her speech was somewhat garbled and non understandable. The patient's daughter called EMS. On EMS arrival, she was noted to have pinpoint pupils, and 1 mg of Narcan was given with some improvement of her condition. She was brought to the Emergency Room for further evaluation.   Apparently, the patient was in a rehabilitation facility, and she was noted to be sundowning at around 5:00 or 6:00 p.m. every day. She was given additional medications, Haldol as well as which sounded like hydroxyzine, and she was discharged recently home. She has been using additional medications such as alprazolam, hydrocodone, also Benadryl continuously at home, in addition to her other medications. The patient's family is concerned about possible interaction.   PAST MEDICAL HISTORY: Significant for history of osteoporosis, stroke, history of hemochromatosis, hypothyroidism, B12 deficiency, hypertension, anxiety, insomnia, status post  appendectomy, hysterectomy, and left tibial plateau fracture in November 2015, a left distal femur fracture status post ORIF in the past, gastric ulcers, ataxia, eosinophilia, dementia with sundowning, osteoarthritis.   ALLERGIES: SULFA, SHELLFISH AS WELL AS ON AMOXICILLIN.   HOME MEDICATIONS: According to medical records, the patient is on: Tylenol 500 mg 1 tablet every 4 hours as needed, acetaminophen/hydrocodone 325/5 mg 1 tablet every 8 hours as needed, alprazolam 0.25 mg twice daily as needed, amlodipine 5 mg p.o. once daily, bisacodyl 10 mg suppository daily as needed, calcium with Vitamin D 600/200 once daily, cyanocobalamin 1000 mcg injectable solution once monthly intramuscularly, docusate sodium 150/8.6 twice daily, folic acid 1 mg once daily, furosemide 10 mg p.o. daily, hydroxyurea 500 mg 1 capsule twice weekly alternating with 2 capsules twice a week, which would be 1000 mg twice a week; magnesium hydroxide 8% oral suspension 30 mL twice daily as needed, metoprolol tartrate 75 mg twice daily, Nexium 40 mg p.o. daily, Pravachol 10 mg p.o. at bedtime, trazodone 25 mg p.o. at bedtime.   FAMILY HISTORY: Coronary artery disease, hypertension, as well as brain tumor in mother.   SOCIAL HISTORY: No tobacco, alcohol abuse, or illicit drug use. Lives at home with her daughter.   REVIEW OF SYSTEMS: Very difficult to obtain, although the patient denies any chest pains or any other kind of pains. According to the patient's daughter, she had diarrhea 2 days ago, which seems to be subsiding now. She has been coughing with some phlegm production, which seems to be clear white, but otherwise no fevers, no chills, were noted by family members.   PHYSICAL EXAMINATION:  VITAL SIGNS: On arrival to the hospital, the patient's vital signs  were temperature was 98, pulse was 87, respirations 16, blood pressure 157/130, and oxygen saturation 100% on room air.  GENERAL: This is a well-developed, well-nourished,  thin, pale Caucasian female, in no significant distress, lying on the stretcher.  HEENT: Her open mouth and she is breathing through  her mouth opened. Nasal cannula is in her nose. Her pupils are 3 mm, reactive to light. Extraocular muscles intact. No icterus or conjunctivitis. Has normal hearing. No pharyngeal erythema. Mucosa is dry.  NECK: No masses. Supple, nontender. Thyroid is not enlarged. No adenopathy. No JVD or carotid bruits bilaterally. Full range of motion.  LUNGS: Clear to auscultation, though diminished breath sounds bilaterally. No rales, rhonchi, or wheezing. No labored inspirations, increased effort, dullness to percussion or overt respiratory distress.  CARDIOVASCULAR: S1, S2 appreciated. Rhythm is regular. PMI not lateralized. Chest is nontender to palpation. There are 1+ pedal pulses. No lower extremity edema, except in the left lower extremity, which is in the brace from her ankle to the hip.  HEMATOLOGIC AND LYMPHATIC: Bruising was noted in the left lower extremity. No calf tenderness or cyanosis.  ABDOMEN: Soft, nontender. Bowel sounds are present. No hepatosplenomegaly or masses were noted.  RECTAL: Deferred. The patient does have diapers on. MUSCULOSKELETAL: Muscle Strength: Not able to assess, as the patient is not very cooperative. No cyanosis, degenerative joint disease, or kyphosis. Gait was not tested.  SKIN: Revealed bruising in the left lower extremity as well as right forehead. No erythema, nodularity, or induration. It was warm and dry to palpation.  LYMPHATIC: No adenopathy in the cervical region.  NEUROLOGIC: Cranial nerves grossly intact. Sensory is difficult to obtain. No Babinski. The patient is somewhat dysarthric. The patient is alert, but intermittently goes to sleep. Not cooperative. Memory is impaired.   LABORATORY DATA: BMP was within normal limits. Lipase level 183. Liver enzymes: Albumin level of 2.9, alkaline phosphatase 146; otherwise, no abnormalities  were found. The patient's troponin was less than 0.02. White blood cell count was normal at 9.4, hemoglobin was 12.3, platelet count was 298,000, absolute neutrophil count was 3.6, which was also within normal limits. The patient's urinalysis showed yellow hazy urine; negative for glucose, bilirubin or ketones; specific gravity 1.012, pH was 5.0; negative for blood, protein, nitrites; leukocyte esterase was negative, less than 1 red blood cell, no white blood cells, 3+ bacteria, 1 epithelial cells, 3 hyaline casts. Acetaminophen level less than 2. Salicylate level less than 1.7. ABGs were performed on 28% FiO2: The pH was 7.4, pCO2 was 49, pO2 was 83, saturation was 100% on current oxygen delivery.   RADIOLOGIC STUDIES: Chest x-ray, PA and lateral, 11/05/2014, showed cardiomegaly, hilar prominence, probably vascular in origin, minimally changed from prior exam; limited evaluation for detection of underlying mass or adenopathy. No segmental consolidation, frank pulmonary edema, or pneumothorax. Maybe small pleural effusions noted on lateral view. Thoracic kyphosis as well as wedge compression deformities and loss of height, L1 vertebral body, incompletely assessed on present exam. Calcified tortuous aorta.   CT scan of head without contrast 11/05/2014, showed no acute intracranial abnormality, small vessel ischemic disease and brain atrophy.   EKG showed sinus rhythm at 89 beats a minute, occasional premature ventricular complexes, nonspecific T-wave abnormality, versus ST abnormality, with ST depressions in lateral leads; no acute ST-T changes, however, were noted.   ASSESSMENT AND PLAN:  1.  Unresponsiveness, very likely metabolic encephalopathy due to multiple medications used. Combination medications such as hydrocodone, Benadryl, trazodone, and Xanax. Will hold Benadryl  as well as opiates. We will follow mental status. We will get cardiac involved. We will get ammonia level. ABGs already observed; has  some mild CO2 retention, but pH is normal. We will follow the patient clinically. We will also get speech therapy involved. We will continue IV fluids, and we will keep her n.p.o. until evaluated by speech.  2.  Diarrhea in the recent past. We will get stool cultures if recurrent.  3.  Recent left tibial plateau fracture, closed, with routine healing subsequent encounter . Will get physical therapy.  4.  Late onset Alzheimer dementia with behavioral disturbances. Will continue the patient on Haldol as needed.   TIME SPENT: 50 minutes.     ____________________________ Katharina Caperima Lashawne Dura, MD rv:MT D: 11/05/2014 10:58:41 ET T: 11/05/2014 11:29:12 ET JOB#: [id number]  cc: Katharina Caperima Elani Delph, MD, <Dictator> Barbette ReichmannVishwanath Hande, MD Alie Moudy MD ELECTRONICALLY SIGNED 12/10/2014 17:49

## 2015-03-30 DEATH — deceased

## 2015-05-12 ENCOUNTER — Other Ambulatory Visit: Payer: Self-pay

## 2015-05-12 ENCOUNTER — Ambulatory Visit: Payer: Self-pay | Admitting: Internal Medicine

## 2015-05-14 ENCOUNTER — Ambulatory Visit: Payer: Self-pay | Admitting: Family Medicine

## 2015-05-14 ENCOUNTER — Ambulatory Visit: Payer: Self-pay

## 2016-07-11 IMAGING — CR DG CHEST 2V
1 series · 2 of 2 positions shown · non-contrast
Comparison: 10/03/2014 and 12/23/2012

CLINICAL DATA: 89-year-old female with right facial droop.
Hypertension. Dementia. Initial encounter.

EXAM:
CHEST  2 VIEW

[Series 1: dxr chest pa (or ap) and lateral · 0.14mm/px · 2 of 2 slices shown]
[im 1/2]
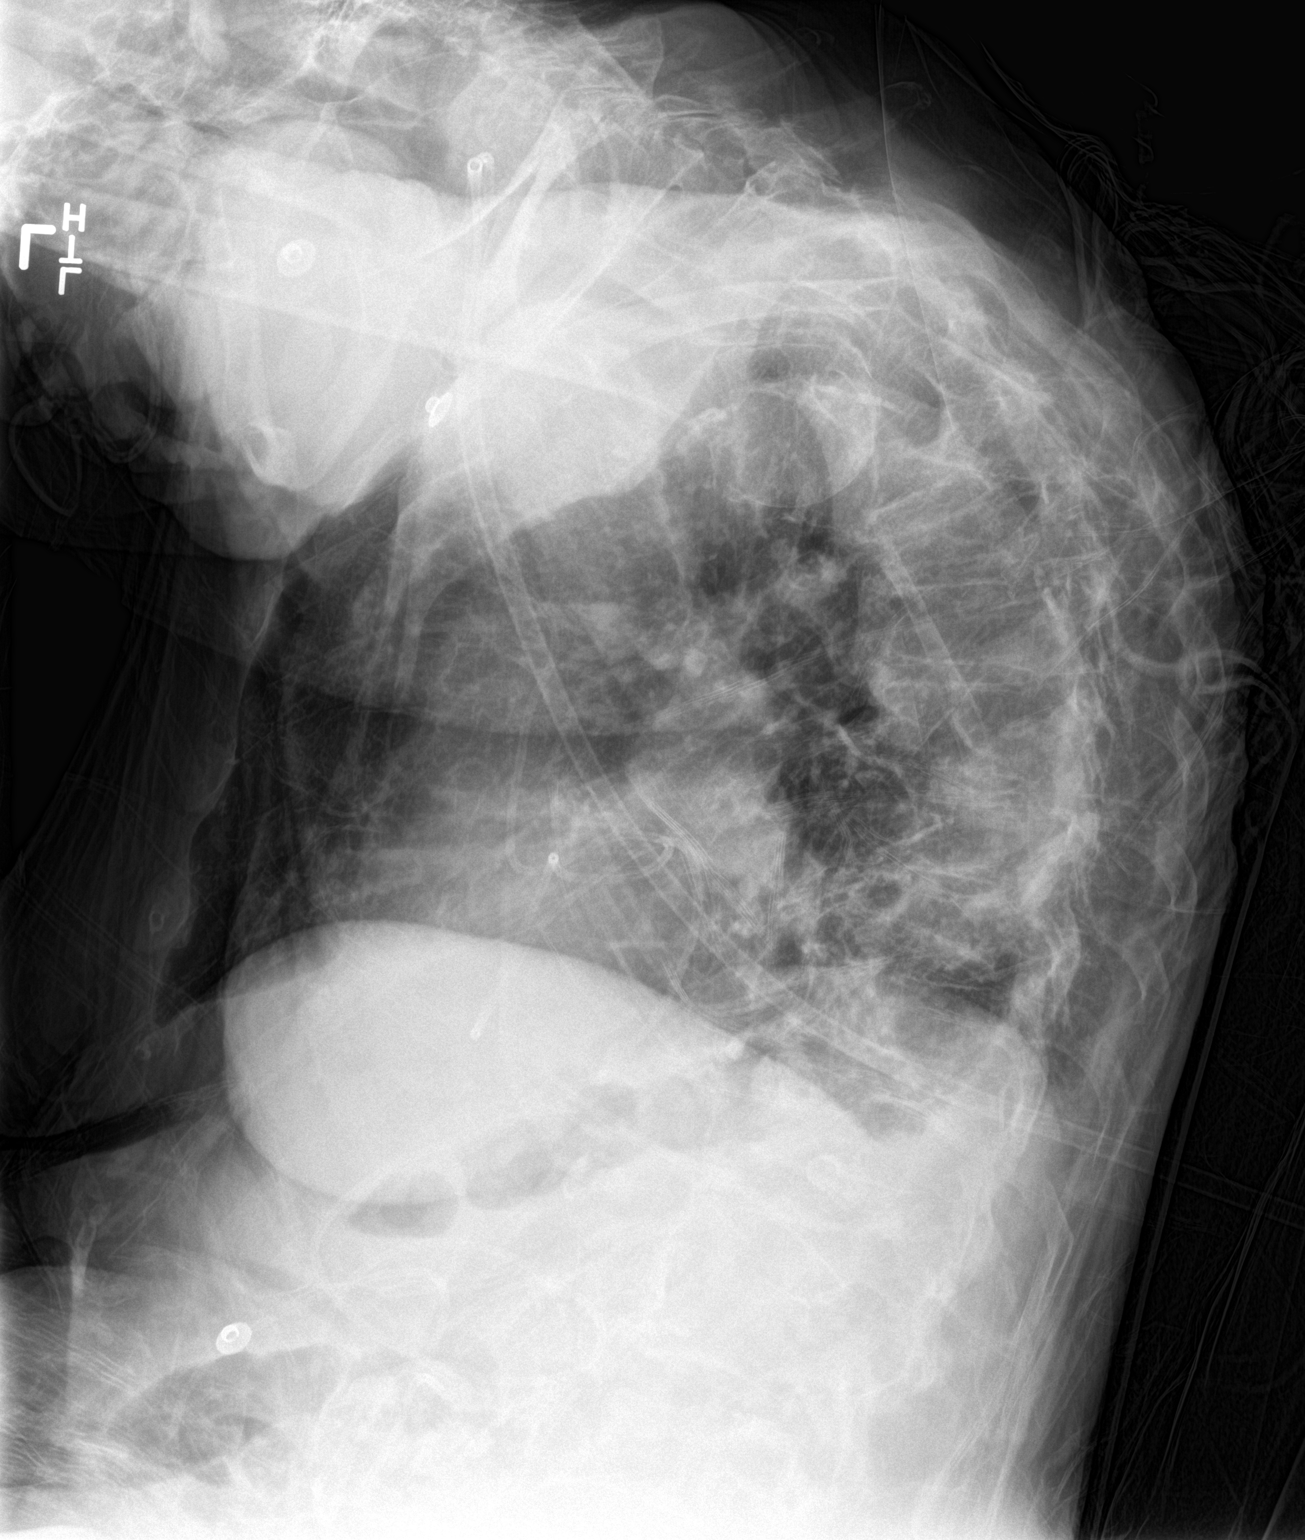
[im 2/2]
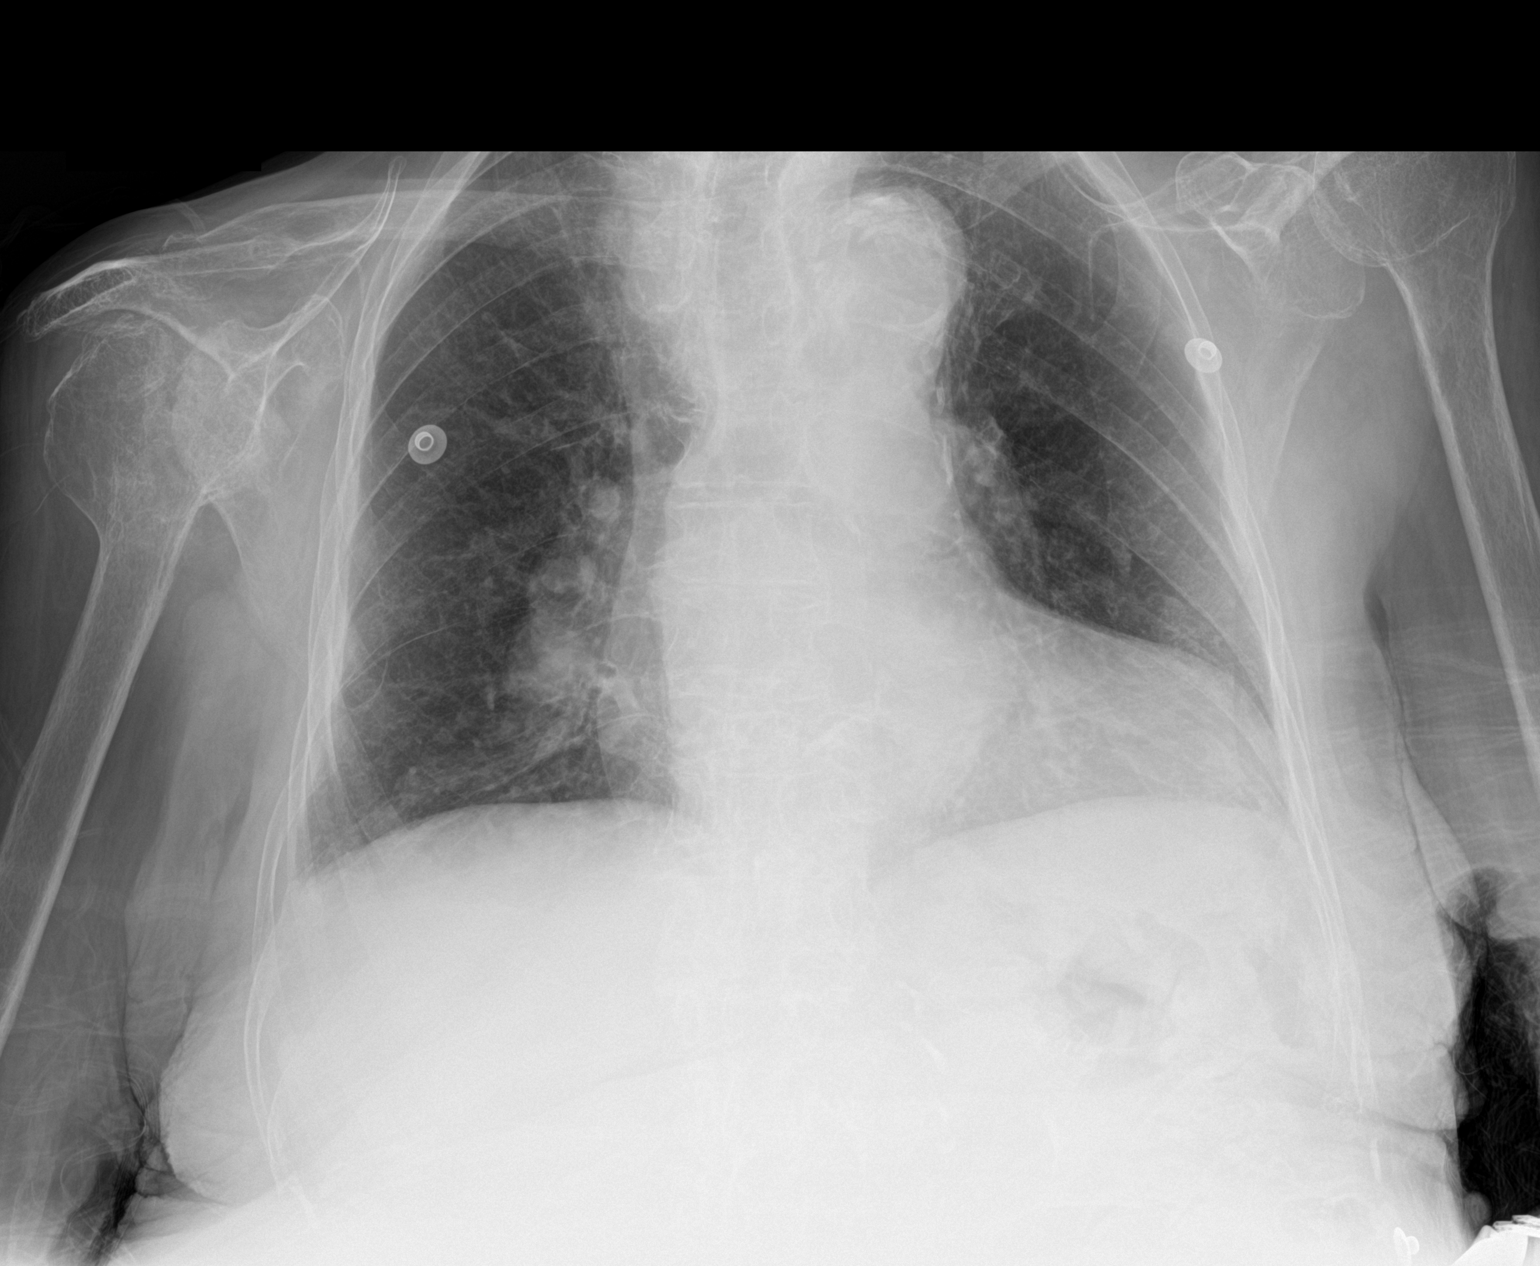

[2 of 2 positions shown; findings below may reference images not displayed]

FINDINGS: Cardiomegaly.

Hilar prominence probably vascular in origin minimally changed from
the prior exam limiting evaluation for detection of underlying mass
or adenopathy.

Right paratracheal prominence probably vascular in origin and
without significant change.

No segmental consolidation, frank pulmonary edema or pneumothorax.
There may be small pleural effusions as noted on the lateral view.

Thoracic kyphosis with wedge compression deformities and loss of
height L1 vertebral body incompletely assessed on present exam.

Calcified tortuous aorta.

Bilateral shoulder joint degenerative changes greater on the right.
IMPRESSION: Cardiomegaly.

Hilar prominence probably vascular in origin minimally changed from
the prior exam limiting evaluation for detection of underlying mass
or adenopathy.

No segmental consolidation, frank pulmonary edema or pneumothorax.
There may be small pleural effusions as noted on the lateral view.

Thoracic kyphosis with wedge compression deformities and loss of
height L1 vertebral body incompletely assessed on present exam.

Calcified tortuous aorta..

## 2016-07-11 IMAGING — CT CT HEAD WITHOUT CONTRAST
3 of 4 series · 16 of 30 positions shown, 17 images · non-contrast
Comparison: 10/03/2014

CLINICAL DATA: Unresponsive.  Right-sided facial droop.

EXAM:
CT HEAD WITHOUT CONTRAST
TECHNIQUE: Contiguous axial images were obtained from the base of the skull
through the vertex without intravenous contrast.

[Series 6: head wo-- · axial · 0.34mm/px · z∈[+390,+474]mm · 4 of 29 slices shown, 5 images]
[im 6/29  brain]
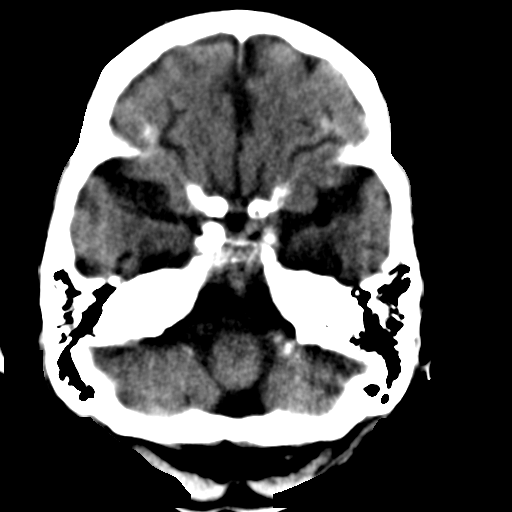
[im 6/29  bone]
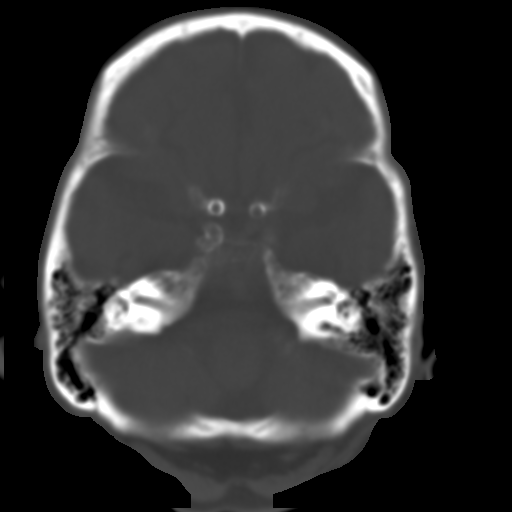
[im 12/29  brain]
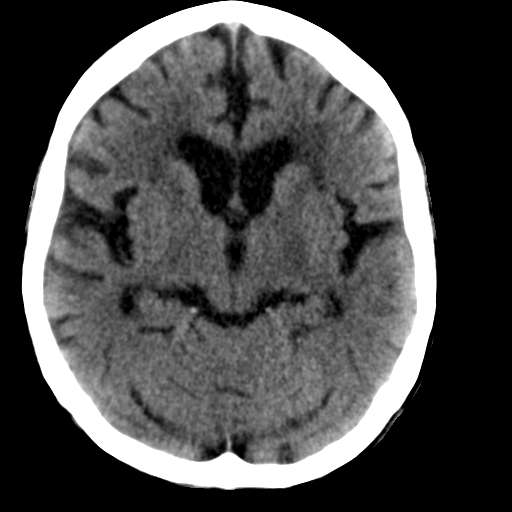
[im 17/29  brain]
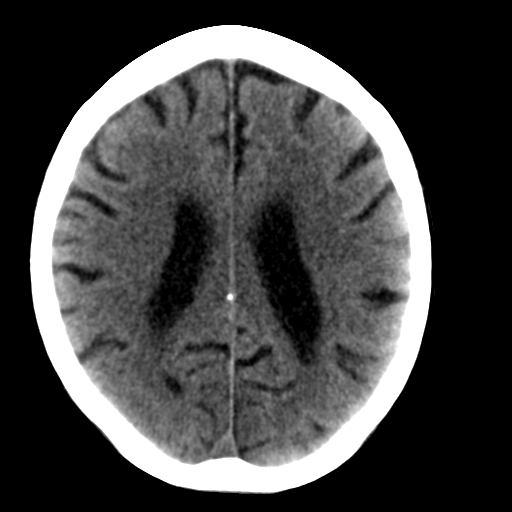
[im 23/29  brain]
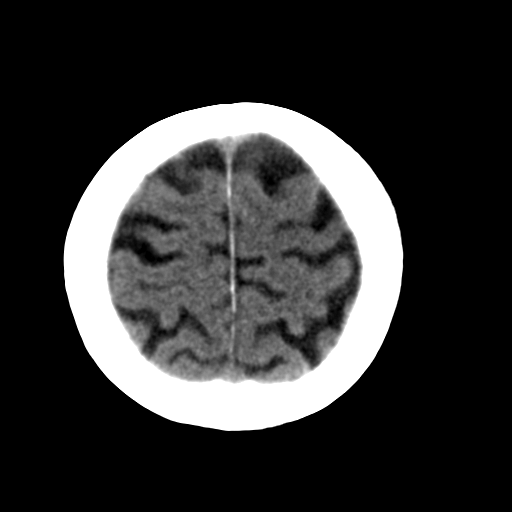

[Series 7: head bone-- · axial · 0.34mm/px · z∈[+365,+503]mm · 8 of 82 slices shown]
[im 6/82  bone]
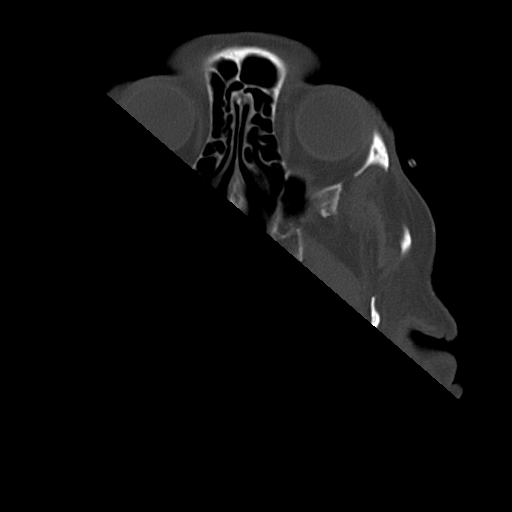
[im 17/82  bone]
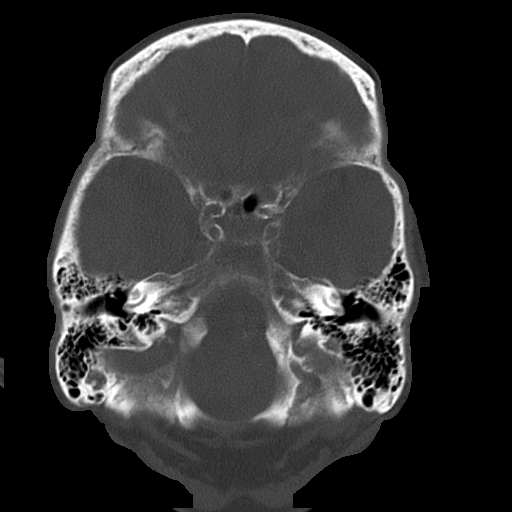
[im 28/82  bone]
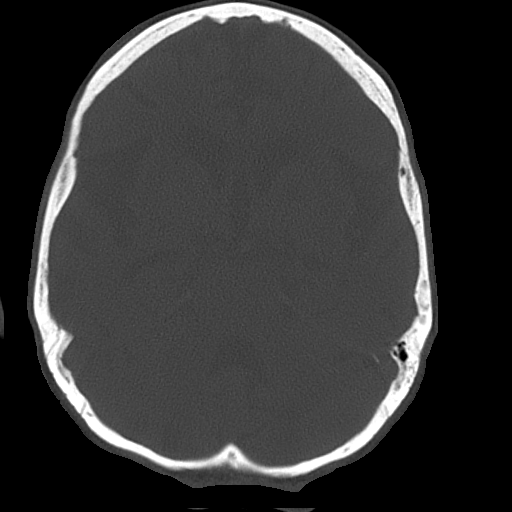
[im 38/82  bone]
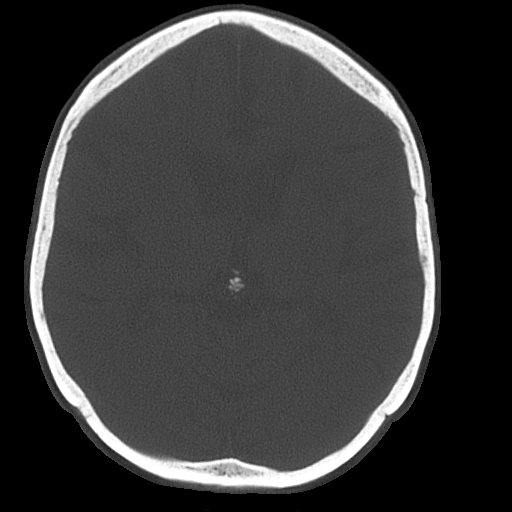
[im 44/82  bone]
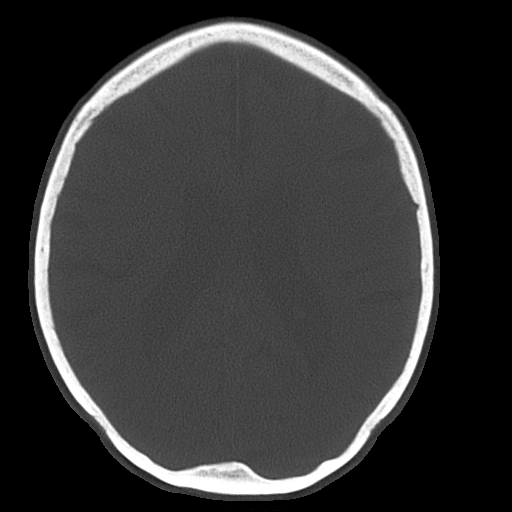
[im 55/82  bone]
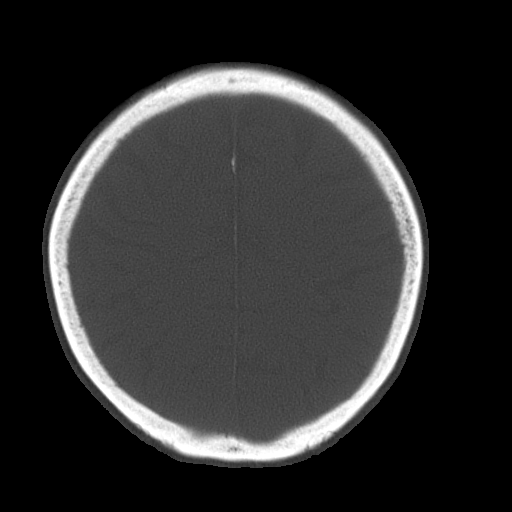
[im 65/82  bone]
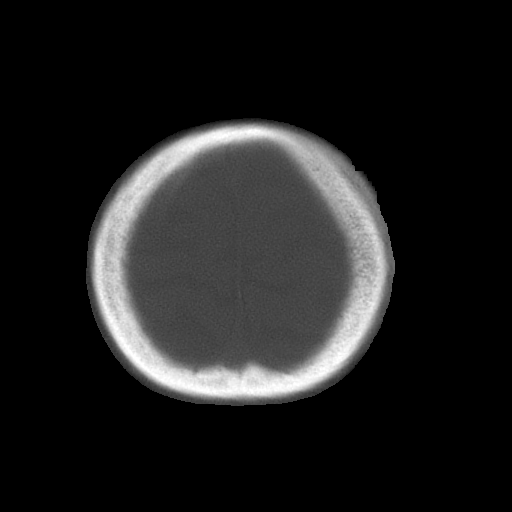
[im 76/82  bone]
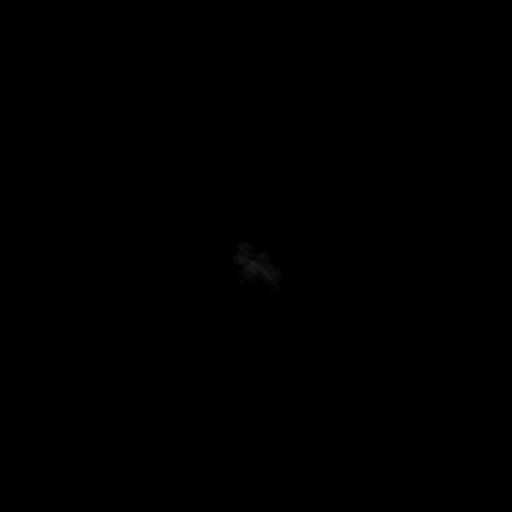

[Series 8: head bone repeat-- · axial · 0.34mm/px · z∈[+456,+491]mm · 4 of 31 slices shown]
[im 7/31  bone]
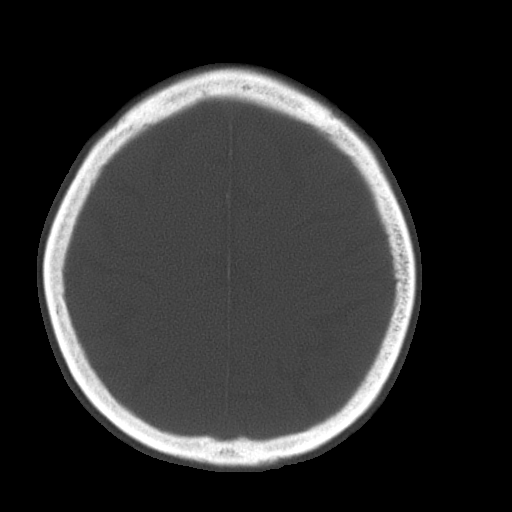
[im 13/31  bone]
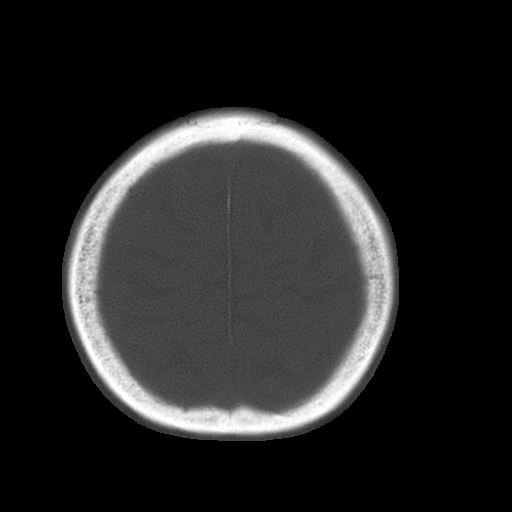
[im 19/31  bone]
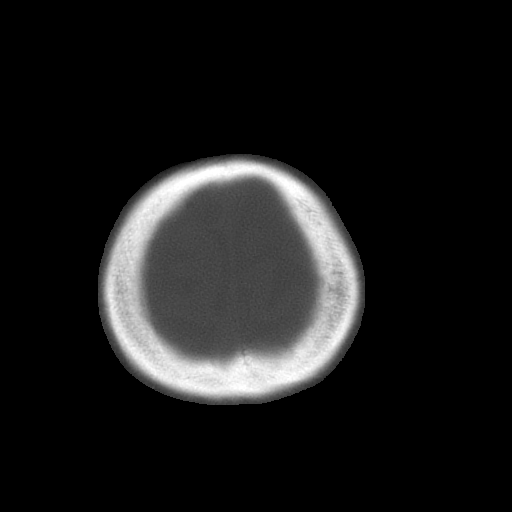
[im 25/31  bone]
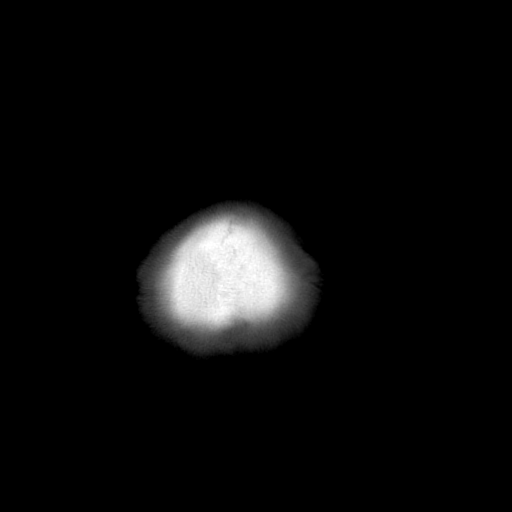

[16 of 30 positions shown; findings below may reference images not displayed]

FINDINGS: Low attenuation throughout the subcortical and periventricular white
matter is identified compatible with small vessel ischemic disease.
The appearance is similar to 10/03/2014. Prominence of the sulci and
ventricles identified consistent with brain atrophy. There is no
evidence for acute brain infarct. No intracranial hemorrhage or
mass. No abnormal extra-axial fluid collections. The paranasal
stress set there is partial opacification of the sphenoid sinus. The
mastoid air cells are clear. The calvarium is intact.
IMPRESSION: 1. No acute intracranial abnormalities.
2. Small vessel ischemic disease and brain atrophy.
